# Patient Record
Sex: Female | Born: 1970 | Race: Black or African American | Hispanic: No | Marital: Married | Smoking: Former smoker
Health system: Southern US, Community
[De-identification: ages and names within clinical notes are randomized; demographics above are authoritative.]

## PROBLEM LIST (undated history)

## (undated) DIAGNOSIS — F329 Major depressive disorder, single episode, unspecified: Secondary | ICD-10-CM

## (undated) DIAGNOSIS — F32A Depression, unspecified: Secondary | ICD-10-CM

## (undated) DIAGNOSIS — R7301 Impaired fasting glucose: Secondary | ICD-10-CM

## (undated) DIAGNOSIS — F419 Anxiety disorder, unspecified: Secondary | ICD-10-CM

## (undated) DIAGNOSIS — E785 Hyperlipidemia, unspecified: Secondary | ICD-10-CM

## (undated) DIAGNOSIS — Z720 Tobacco use: Secondary | ICD-10-CM

## (undated) DIAGNOSIS — I10 Essential (primary) hypertension: Secondary | ICD-10-CM

## (undated) DIAGNOSIS — E669 Obesity, unspecified: Secondary | ICD-10-CM

## (undated) DIAGNOSIS — S41109A Unspecified open wound of unspecified upper arm, initial encounter: Secondary | ICD-10-CM

## (undated) HISTORY — DX: Impaired fasting glucose: R73.01

## (undated) HISTORY — PX: TUBAL LIGATION: SHX77

## (undated) HISTORY — DX: Obesity, unspecified: E66.9

## (undated) HISTORY — DX: Hyperlipidemia, unspecified: E78.5

## (undated) HISTORY — DX: Tobacco use: Z72.0

## (undated) HISTORY — DX: Unspecified open wound of unspecified upper arm, initial encounter: S41.109A

## (undated) HISTORY — DX: Essential (primary) hypertension: I10

---

## 1999-11-24 DIAGNOSIS — S41109A Unspecified open wound of unspecified upper arm, initial encounter: Secondary | ICD-10-CM

## 1999-11-24 HISTORY — DX: Unspecified open wound of unspecified upper arm, initial encounter: S41.109A

## 1999-11-24 HISTORY — PX: FOREARM FRACTURE SURGERY: SHX649

## 2005-01-03 ENCOUNTER — Emergency Department: Payer: Self-pay | Admitting: Emergency Medicine

## 2012-03-10 ENCOUNTER — Ambulatory Visit: Payer: Self-pay | Admitting: Internal Medicine

## 2013-12-22 ENCOUNTER — Emergency Department: Payer: Self-pay | Admitting: Emergency Medicine

## 2015-05-10 ENCOUNTER — Telehealth: Payer: Self-pay | Admitting: Family Medicine

## 2015-05-10 MED ORDER — LOSARTAN POTASSIUM 25 MG PO TABS: 25.0000 mg | ORAL_TABLET | Freq: Every day | ORAL | Status: DC

## 2015-05-10 NOTE — Telephone Encounter (Signed)
Left vm on patient phone to call us back and schedule f/u with labs. Rx was sent for 7 day supply

## 2015-05-10 NOTE — Telephone Encounter (Signed)
[facility] in [place] called stated pt needs refill on Losartan. Thanks.

## 2015-05-10 NOTE — Telephone Encounter (Signed)
Please let QUINLAN MCFALL know that I'd like to see patient for an appointment here in the office for:  Blood pressure and labs We need to know if her kidneys are happy with this new medicine I sent 7 day supply Please schedule a visit with me  in the next: 7 days Fasting?  No Thank you, Dr. Sanda Klein

## 2015-05-16 ENCOUNTER — Telehealth: Payer: Self-pay | Admitting: Family Medicine

## 2015-05-16 NOTE — Telephone Encounter (Signed)
PT CAME IN A WANTED TO GET LABS DONE BUT THERE WERE NO LAB ORDERS IN SO I MADE HER AN APPOINTMENT FOR TOMORROW JUST IN CASE SINCE WE ARE NOW SCHEDULING LABS. THE PT WOULD LIKE A CALL BACK AT [phone]

## 2015-05-17 ENCOUNTER — Other Ambulatory Visit: Payer: Self-pay

## 2015-05-17 NOTE — Telephone Encounter (Signed)
She actually needs an office visit with Dr. Sanda Klein, not just labs. Can you call her and schedule her for an appointment? Thanks!

## 2015-07-10 ENCOUNTER — Other Ambulatory Visit: Payer: Self-pay

## 2015-07-10 MED ORDER — LOSARTAN POTASSIUM 25 MG PO TABS: 25.0000 mg | ORAL_TABLET | Freq: Every day | ORAL | Status: DC

## 2015-07-10 NOTE — Telephone Encounter (Signed)
Please let Lauren Hughes know that I'd like to see patient for an appointment here in the office for:  High blood pressure and cholesterol Please schedule a visit with me  in the next: month Fasting?  Yes Thank you, Dr. Sanda Klein I DID send refill of medicine for one month to last so she won't run out

## 2015-07-12 NOTE — Telephone Encounter (Signed)
Left message to call.

## 2015-07-15 NOTE — Telephone Encounter (Signed)
Patient notified, she does not have insurance right now so she can't come in until it starts in Oct. Appt. Made for 09/09/15.

## 2015-09-02 DIAGNOSIS — I10 Essential (primary) hypertension: Secondary | ICD-10-CM | POA: Insufficient documentation

## 2015-09-02 DIAGNOSIS — Z720 Tobacco use: Secondary | ICD-10-CM | POA: Insufficient documentation

## 2015-09-02 DIAGNOSIS — R7301 Impaired fasting glucose: Secondary | ICD-10-CM | POA: Insufficient documentation

## 2015-09-02 DIAGNOSIS — E785 Hyperlipidemia, unspecified: Secondary | ICD-10-CM | POA: Insufficient documentation

## 2015-09-02 DIAGNOSIS — Z87891 Personal history of nicotine dependence: Secondary | ICD-10-CM | POA: Insufficient documentation

## 2015-09-09 ENCOUNTER — Ambulatory Visit: Payer: Self-pay | Admitting: Family Medicine

## 2015-10-16 ENCOUNTER — Encounter: Payer: Self-pay | Admitting: Family Medicine

## 2016-01-23 ENCOUNTER — Encounter (INDEPENDENT_AMBULATORY_CARE_PROVIDER_SITE_OTHER): Payer: BLUE CROSS/BLUE SHIELD | Admitting: Ophthalmology

## 2016-01-23 DIAGNOSIS — H35413 Lattice degeneration of retina, bilateral: Secondary | ICD-10-CM | POA: Diagnosis not present

## 2016-01-23 DIAGNOSIS — H43813 Vitreous degeneration, bilateral: Secondary | ICD-10-CM | POA: Diagnosis not present

## 2016-01-23 DIAGNOSIS — H33303 Unspecified retinal break, bilateral: Secondary | ICD-10-CM | POA: Diagnosis not present

## 2016-01-23 DIAGNOSIS — I10 Essential (primary) hypertension: Secondary | ICD-10-CM

## 2016-01-23 DIAGNOSIS — H35033 Hypertensive retinopathy, bilateral: Secondary | ICD-10-CM

## 2016-02-17 ENCOUNTER — Encounter (INDEPENDENT_AMBULATORY_CARE_PROVIDER_SITE_OTHER): Payer: BLUE CROSS/BLUE SHIELD | Admitting: Ophthalmology

## 2016-02-20 ENCOUNTER — Encounter (INDEPENDENT_AMBULATORY_CARE_PROVIDER_SITE_OTHER): Payer: BLUE CROSS/BLUE SHIELD | Admitting: Ophthalmology

## 2016-08-06 ENCOUNTER — Encounter: Payer: Self-pay | Admitting: Family Medicine

## 2016-08-06 ENCOUNTER — Ambulatory Visit (INDEPENDENT_AMBULATORY_CARE_PROVIDER_SITE_OTHER): Payer: BLUE CROSS/BLUE SHIELD | Admitting: Family Medicine

## 2016-08-06 VITALS — BP 177/98 | HR 96 | Temp 98.7°F | Ht 66.0 in | Wt 192.0 lb

## 2016-08-06 DIAGNOSIS — I10 Essential (primary) hypertension: Secondary | ICD-10-CM | POA: Diagnosis not present

## 2016-08-06 MED ORDER — AMLODIPINE BESYLATE 10 MG PO TABS: 10.0000 mg | ORAL_TABLET | Freq: Every day | ORAL | 0 refills | Status: DC

## 2016-08-06 NOTE — Assessment & Plan Note (Addendum)
Restart 10 mg amlodipine, BP check during PE in one month as she is overdue. She will stop in the drug store about once weekly to check BPs and let us know if it's abnormal

## 2016-08-06 NOTE — Progress Notes (Signed)
BP (!) 177/98 (BP Location: Left Arm, Patient Position: Sitting, Cuff Size: Normal)   Pulse 96   Temp 98.7 F (37.1 C)   Ht 5\' 6"  (1.676 m)   Wt 192 lb (87.1 kg)   LMP 07/31/2016 (Exact Date)   SpO2 99%   BMI 30.99 kg/m    Subjective:    Patient ID: Lauren Hughes, female    DOB: November 09, 1971, 45 y.o.   MRN: [id number]  HPI: Lauren Hughes is a 45 y.o. female  Chief Complaint  Patient presents with  . Hypertension   Patient presents for HTN follow up after being lost to follow up since May 2016. Has been off all medications for 1 month, most recently was prescribed losartan 25 mg but stopped d/t not seeing a big difference in her BPs when she would check at drugstore. Several months ago, she went back on the amlodipine (d/c'ed in May 2016 d/t being ineffective, but pt admits now that she was only taking it occasionally at that time).  Prior to both of these, she had tried lisinopril and HCTZ but did not tolerate them well d/t cough and cramps.  Wanting to get back on the amlodipine at this time. Checking her BPs occasionally at the drug store but looking into getting a home cuff.   Relevant past medical, surgical, family and social history reviewed and updated as indicated. Interim medical history since our last visit reviewed. Allergies and medications reviewed and updated.  Review of Systems  Constitutional: Negative.   HENT: Negative.   Eyes: Negative.   Respiratory: Negative.   Cardiovascular: Negative.   Gastrointestinal: Negative.   Genitourinary: Negative.   Musculoskeletal: Negative.   Neurological: Negative.   Psychiatric/Behavioral: Negative.     Per HPI unless specifically indicated above     Objective:    BP (!) 177/98 (BP Location: Left Arm, Patient Position: Sitting, Cuff Size: Normal)   Pulse 96   Temp 98.7 F (37.1 C)   Ht 5\' 6"  (1.676 m)   Wt 192 lb (87.1 kg)   LMP 07/31/2016 (Exact Date)   SpO2 99%   BMI 30.99 kg/m   Wt Readings from Last 3  Encounters:  08/06/16 192 lb (87.1 kg)  04/08/15 199 lb (90.3 kg)    Physical Exam  Constitutional: She is oriented to person, place, and time. She appears well-developed and well-nourished.  HENT:  Head: Atraumatic.  Eyes: Conjunctivae are normal. No scleral icterus.  Neck: Normal range of motion. Neck supple.  Cardiovascular: Normal rate, regular rhythm and normal heart sounds.   Pulmonary/Chest: Effort normal and breath sounds normal. No respiratory distress.  Musculoskeletal: Normal range of motion.  Neurological: She is alert and oriented to person, place, and time.  Skin: Skin is warm and dry.  Psychiatric: She has a normal mood and affect. Her behavior is normal.  Nursing note and vitals reviewed.   No results found for this or any previous visit.    Assessment & Plan:   Problem List Items Addressed This Visit      Cardiovascular and Mediastinum   Hypertension - Primary    Restart 10 mg amlodipine, BP check during PE in one month as she is overdue. She will stop in the drug store about once weekly to check BPs and let us know if it's abnormal      Relevant Medications   amLODipine (NORVASC) 10 MG tablet    Other Visit Diagnoses   None.  Follow up plan: Return in about 4 weeks (around 09/03/2016) for annual exam, BP check.

## 2016-08-06 NOTE — Patient Instructions (Signed)
Follow up for Physical Exam 

## 2016-09-03 ENCOUNTER — Ambulatory Visit (INDEPENDENT_AMBULATORY_CARE_PROVIDER_SITE_OTHER): Payer: BLUE CROSS/BLUE SHIELD | Admitting: Family Medicine

## 2016-09-03 ENCOUNTER — Encounter: Payer: Self-pay | Admitting: Family Medicine

## 2016-09-03 VITALS — BP 133/84 | HR 83 | Temp 98.8°F | Ht 66.0 in | Wt 188.0 lb

## 2016-09-03 DIAGNOSIS — Z0000 Encounter for general adult medical examination without abnormal findings: Secondary | ICD-10-CM | POA: Diagnosis not present

## 2016-09-03 DIAGNOSIS — Z23 Encounter for immunization: Secondary | ICD-10-CM

## 2016-09-03 DIAGNOSIS — R7301 Impaired fasting glucose: Secondary | ICD-10-CM | POA: Diagnosis not present

## 2016-09-03 DIAGNOSIS — E785 Hyperlipidemia, unspecified: Secondary | ICD-10-CM

## 2016-09-03 DIAGNOSIS — F330 Major depressive disorder, recurrent, mild: Secondary | ICD-10-CM

## 2016-09-03 DIAGNOSIS — Z114 Encounter for screening for human immunodeficiency virus [HIV]: Secondary | ICD-10-CM

## 2016-09-03 DIAGNOSIS — I10 Essential (primary) hypertension: Secondary | ICD-10-CM

## 2016-09-03 DIAGNOSIS — Z1329 Encounter for screening for other suspected endocrine disorder: Secondary | ICD-10-CM | POA: Diagnosis not present

## 2016-09-03 LAB — UA/M W/RFLX CULTURE, ROUTINE
Bilirubin, UA: NEGATIVE
GLUCOSE, UA: NEGATIVE
Ketones, UA: NEGATIVE
Leukocytes, UA: NEGATIVE
NITRITE UA: NEGATIVE
Specific Gravity, UA: 1.025 (ref 1.005–1.030)
UUROB: 0.2 mg/dL (ref 0.2–1.0)
pH, UA: 5 (ref 5.0–7.5)

## 2016-09-03 LAB — MICROSCOPIC EXAMINATION: WBC UA: NONE SEEN /HPF (ref 0–?)

## 2016-09-03 MED ORDER — METOPROLOL SUCCINATE ER 25 MG PO TB24: 25.0000 mg | ORAL_TABLET | Freq: Every day | ORAL | 3 refills | Status: DC

## 2016-09-03 MED ORDER — BUPROPION HCL ER (XL) 150 MG PO TB24
150.0000 mg | ORAL_TABLET | Freq: Every day | ORAL | 0 refills | Status: DC
Start: 2016-09-03 — End: 2016-10-05

## 2016-09-03 NOTE — Assessment & Plan Note (Signed)
Will start wellbutrin, discussed that this may also be a way to help with smoking cessation and to set a quit date. List of counselors given, she is agreeable to trying counseling in addition to medication
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
## 2016-09-03 NOTE — Assessment & Plan Note (Signed)
Given variability of BPs, will add metoprolol 25 mg and see how she does. Recheck in 1 month. Discussed to stop if feeling dizzy, lightheaded, etc

## 2016-09-04 ENCOUNTER — Telehealth: Payer: Self-pay | Admitting: Family Medicine

## 2016-09-04 LAB — COMPREHENSIVE METABOLIC PANEL
A/G RATIO: 1.4 (ref 1.2–2.2)
ALBUMIN: 4.2 g/dL (ref 3.5–5.5)
ALK PHOS: 70 IU/L (ref 39–117)
ALT: 21 IU/L (ref 0–32)
AST: 21 IU/L (ref 0–40)
BILIRUBIN TOTAL: 0.3 mg/dL (ref 0.0–1.2)
BUN / CREAT RATIO: 13 (ref 9–23)
BUN: 9 mg/dL (ref 6–24)
CHLORIDE: 100 mmol/L (ref 96–106)
CO2: 25 mmol/L (ref 18–29)
Calcium: 9.5 mg/dL (ref 8.7–10.2)
Creatinine, Ser: 0.67 mg/dL (ref 0.57–1.00)
GFR calc non Af Amer: 108 mL/min/{1.73_m2} (ref >59)
GFR, EST AFRICAN AMERICAN: 125 mL/min/{1.73_m2} (ref >59)
GLUCOSE: 92 mg/dL (ref 65–99)
Globulin, Total: 3 g/dL (ref 1.5–4.5)
POTASSIUM: 3.9 mmol/L (ref 3.5–5.2)
Sodium: 142 mmol/L (ref 134–144)
TOTAL PROTEIN: 7.2 g/dL (ref 6.0–8.5)

## 2016-09-04 LAB — CBC WITH DIFFERENTIAL/PLATELET
BASOS ABS: 0 10*3/uL (ref 0.0–0.2)
Basos: 0 %
EOS (ABSOLUTE): 0.2 10*3/uL (ref 0.0–0.4)
EOS: 2 %
Hematocrit: 39.2 % (ref 34.0–46.6)
Hemoglobin: 13.6 g/dL (ref 11.1–15.9)
IMMATURE GRANS (ABS): 0 10*3/uL (ref 0.0–0.1)
Immature Granulocytes: 0 %
LYMPHS ABS: 1.9 10*3/uL (ref 0.7–3.1)
Lymphs: 30 %
MCH: 32.2 pg (ref 26.6–33.0)
MCHC: 34.7 g/dL (ref 31.5–35.7)
MCV: 93 fL (ref 79–97)
MONOCYTES: 7 %
Monocytes Absolute: 0.5 10*3/uL (ref 0.1–0.9)
NEUTROS PCT: 61 %
Neutrophils Absolute: 4 10*3/uL (ref 1.4–7.0)
PLATELETS: 296 10*3/uL (ref 150–379)
RBC: 4.23 x10E6/uL (ref 3.77–5.28)
RDW: 13.7 % (ref 12.3–15.4)
WBC: 6.6 10*3/uL (ref 3.4–10.8)

## 2016-09-04 LAB — LIPID PANEL W/O CHOL/HDL RATIO
CHOLESTEROL TOTAL: 230 mg/dL — AB (ref 100–199)
HDL: 66 mg/dL (ref >39)
LDL Calculated: 145 mg/dL — ABNORMAL HIGH (ref 0–99)
Triglycerides: 95 mg/dL (ref 0–149)
VLDL Cholesterol Cal: 19 mg/dL (ref 5–40)

## 2016-09-04 LAB — TSH: TSH: 1.2 u[IU]/mL (ref 0.450–4.500)

## 2016-09-04 LAB — HEMOGLOBIN A1C
ESTIMATED AVERAGE GLUCOSE: 111 mg/dL
HEMOGLOBIN A1C: 5.5 % (ref 4.8–5.6)

## 2016-09-04 LAB — HIV ANTIBODY (ROUTINE TESTING W REFLEX): HIV Screen 4th Generation wRfx: NONREACTIVE

## 2016-09-04 NOTE — Telephone Encounter (Signed)
Left message for patient to return my call.

## 2016-09-04 NOTE — Telephone Encounter (Signed)
Please call pt and let her know all of her labs came back normal except her cholesterol is elevated. This can be improved by good diet and exercise and adding fish oil, but want to recheck in 6 months to see if we need to add a cholesterol medication if no improvement with lifestyle modification and fish oil.

## 2016-09-07 NOTE — Telephone Encounter (Signed)
Left message to call.

## 2016-09-07 NOTE — Telephone Encounter (Signed)
Patient notified

## 2016-09-09 ENCOUNTER — Other Ambulatory Visit: Payer: Self-pay | Admitting: Family Medicine

## 2016-09-15 ENCOUNTER — Encounter: Payer: Self-pay | Admitting: Family Medicine

## 2016-10-05 ENCOUNTER — Encounter: Payer: Self-pay | Admitting: Family Medicine

## 2016-10-05 ENCOUNTER — Ambulatory Visit (INDEPENDENT_AMBULATORY_CARE_PROVIDER_SITE_OTHER): Payer: BLUE CROSS/BLUE SHIELD | Admitting: Family Medicine

## 2016-10-05 VITALS — BP 144/90 | HR 85 | Temp 99.2°F | Wt 191.0 lb

## 2016-10-05 DIAGNOSIS — L918 Other hypertrophic disorders of the skin: Secondary | ICD-10-CM | POA: Diagnosis not present

## 2016-10-05 DIAGNOSIS — E785 Hyperlipidemia, unspecified: Secondary | ICD-10-CM

## 2016-10-05 DIAGNOSIS — Z720 Tobacco use: Secondary | ICD-10-CM

## 2016-10-05 DIAGNOSIS — I10 Essential (primary) hypertension: Secondary | ICD-10-CM

## 2016-10-05 DIAGNOSIS — F330 Major depressive disorder, recurrent, mild: Secondary | ICD-10-CM

## 2016-10-05 MED ORDER — BUPROPION HCL ER (XL) 150 MG PO TB24: 150.0000 mg | ORAL_TABLET | Freq: Two times a day (BID) | ORAL | 4 refills | Status: DC

## 2016-10-05 NOTE — Assessment & Plan Note (Signed)
Increase Wellbutrin to twice daily. Follow up in 1 month

## 2016-10-05 NOTE — Assessment & Plan Note (Addendum)
Continue fish oil supplements. Work on diet and exercise modifications to help lower cholesterol further. Will recheck in 4 months

## 2016-10-05 NOTE — Patient Instructions (Signed)
Follow up in 1 month. Start taking metoprolol, and increase wellbutrin to twice daily

## 2016-10-05 NOTE — Assessment & Plan Note (Signed)
Discussed setting a quit date, continue working toward cessation goals. Recommended cessation classes at [facility]

## 2016-10-05 NOTE — Progress Notes (Signed)
BP (!) 144/90   Pulse 85   Temp 99.2 F (37.3 C)   Wt 191 lb (86.6 kg)   LMP 09/20/2016   SpO2 98%   BMI 30.83 kg/m    Subjective:    Patient ID: Lauren Hughes, female    DOB: Jan 07, 1971, 45 y.o.   MRN: [id number]  HPI: Lauren Hughes is a 45 y.o. female  Chief Complaint  Patient presents with  . Hypertension    follow up, patient never started the metoprolol.  . Depression   Patient presents today for follow up HTN and depression. States she never started the metoprolol because she wasn't sure if she was supposed to or not. Checked her BP a handful of times at the pharmacy and states it was elevated at those times. Denies CP, HAs, vision changes, SOB. Still taking amlodipine daily with no side effects.   Doing well on the wellbutrin so far. States the first two weeks she felt better than she has in a very long time, very energetic and motivated. This past week she felt less like that, but still better than before. Currently taking 150 mg QD. Denies SI/HI. States the medicine has still not helped her quit smoking, but she is hoping to quit soon and wants to make New Years her quit date.   Started taking fish oil daily as recommended to help with her cholesterol. Is not currently making any diet or exercise modifications. Does work a very physical job, so stays relatively active.   Does have some skin tags that she would like to be frozen off today on her face and neck. They have been getting caught in her clothes and bleeding.   Depression screen [facility] 2/9 10/05/2016 09/03/2016  Decreased Interest 1 1  Down, Depressed, Hopeless 1 3  PHQ - 2 Score 2 4  Altered sleeping 3 1  Tired, decreased energy 2 1  Change in appetite 1 1  Feeling bad or failure about yourself  1 0  Trouble concentrating 1 0  Moving slowly or fidgety/restless 0 0  Suicidal thoughts 0 0  PHQ-9 Score 10 7  Difficult doing work/chores - Not difficult at all    Relevant past medical, surgical, family  and social history reviewed and updated as indicated. Interim medical history since our last visit reviewed. Allergies and medications reviewed and updated.  Review of Systems  Constitutional: Negative.   HENT: Negative.   Respiratory: Negative.   Cardiovascular: Negative.   Gastrointestinal: Negative.   Genitourinary: Negative.   Musculoskeletal: Negative.   Skin: Negative.   Neurological: Negative.   Psychiatric/Behavioral: Positive for dysphoric mood.    Per HPI unless specifically indicated above     Objective:    BP (!) 144/90   Pulse 85   Temp 99.2 F (37.3 C)   Wt 191 lb (86.6 kg)   LMP 09/20/2016   SpO2 98%   BMI 30.83 kg/m   Wt Readings from Last 3 Encounters:  10/05/16 191 lb (86.6 kg)  09/03/16 188 lb (85.3 kg)  08/06/16 192 lb (87.1 kg)    Physical Exam  Constitutional: She is oriented to person, place, and time. She appears well-developed and well-nourished. No distress.  Eyes: Conjunctivae are normal. Pupils are equal, round, and reactive to light. No scleral icterus.  Neck: Normal range of motion. Neck supple.  Cardiovascular: Normal rate, regular rhythm and normal heart sounds.   Pulmonary/Chest: Effort normal. No respiratory distress.  Musculoskeletal: Normal range of motion.  Neurological: She is alert and oriented to person, place, and time.  Skin: Skin is warm and dry.  Skin tag below left eye and several along anterior and posterior neck  Psychiatric: She has a normal mood and affect. Her behavior is normal.  Nursing note and vitals reviewed.   Procedure: Cryotherapy was used to destroy 5 skin tags on neck and face. The procedure was well tolerated with no immediate complications noted. Aftercare was discussed with patient.      Assessment & Plan:   Problem List Items Addressed This Visit      Cardiovascular and Mediastinum   Hypertension - Primary    Continue amlodipine, begin taking metoprolol. Recheck in 1 month        Other    Tobacco use    Discussed setting a quit date, continue working toward cessation goals. Recommended cessation classes at [facility]      Dyslipidemia    Continue fish oil supplements. Work on diet and exercise modifications to help lower cholesterol further. Will recheck in 4 months      Mild episode of recurrent major depressive disorder ([facility])    Increase Wellbutrin to twice daily. Follow up in 1 month      Relevant Medications   buPROPion (WELLBUTRIN XL) 150 MG 24 hr tablet    Other Visit Diagnoses    Inflamed skin tag       Cryotherapy was used to destroy skin tags. May require a second treatment as some were sizeable with wide bases. Discussed after care       Follow up plan: Return in about 4 weeks (around 11/02/2016) for BP, Depression.

## 2016-10-05 NOTE — Assessment & Plan Note (Signed)
Continue amlodipine, begin taking metoprolol. Recheck in 1 month

## 2016-10-07 ENCOUNTER — Encounter: Payer: Self-pay | Admitting: Family Medicine

## 2016-11-03 ENCOUNTER — Ambulatory Visit (INDEPENDENT_AMBULATORY_CARE_PROVIDER_SITE_OTHER): Payer: BLUE CROSS/BLUE SHIELD | Admitting: Family Medicine

## 2016-11-03 ENCOUNTER — Encounter: Payer: Self-pay | Admitting: Family Medicine

## 2016-11-03 VITALS — BP 129/86 | HR 94 | Temp 99.3°F | Wt 191.0 lb

## 2016-11-03 DIAGNOSIS — I10 Essential (primary) hypertension: Secondary | ICD-10-CM

## 2016-11-03 DIAGNOSIS — B309 Viral conjunctivitis, unspecified: Secondary | ICD-10-CM

## 2016-11-03 DIAGNOSIS — F330 Major depressive disorder, recurrent, mild: Secondary | ICD-10-CM | POA: Diagnosis not present

## 2016-11-03 MED ORDER — ERYTHROMYCIN 5 MG/GM OP OINT: 1.0000 "application " | TOPICAL_OINTMENT | Freq: Every day | OPHTHALMIC | 0 refills | Status: DC

## 2016-11-03 NOTE — Assessment & Plan Note (Signed)
Doing very well on wellbutrin once daily, continue current regimen.
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
## 2016-11-03 NOTE — Patient Instructions (Signed)
Follow up in 4 months for regular BP and cholesterol check. Come in fasting.

## 2016-11-03 NOTE — Assessment & Plan Note (Signed)
Stable on amlodipine, continue current regimen 

## 2017-03-09 ENCOUNTER — Ambulatory Visit (INDEPENDENT_AMBULATORY_CARE_PROVIDER_SITE_OTHER): Payer: BLUE CROSS/BLUE SHIELD | Admitting: Family Medicine

## 2017-03-09 ENCOUNTER — Encounter: Payer: Self-pay | Admitting: Family Medicine

## 2017-03-09 ENCOUNTER — Other Ambulatory Visit: Payer: Self-pay | Admitting: Family Medicine

## 2017-03-09 VITALS — BP 148/90 | HR 86 | Temp 99.0°F | Wt 197.0 lb

## 2017-03-09 DIAGNOSIS — I10 Essential (primary) hypertension: Secondary | ICD-10-CM | POA: Diagnosis not present

## 2017-03-09 DIAGNOSIS — E785 Hyperlipidemia, unspecified: Secondary | ICD-10-CM

## 2017-03-09 MED ORDER — LISINOPRIL 10 MG PO TABS: 10.0000 mg | ORAL_TABLET | Freq: Every day | ORAL | 0 refills | Status: DC

## 2017-03-09 NOTE — Assessment & Plan Note (Signed)
Not to goal, will add lisinopril 10 mg. Discussed ideal range, pt will restart home monitoring and alert [place] if abnormal readings. Side effects to watch for reviewed. F/u in 6 weeks for recheck.

## 2017-03-09 NOTE — Progress Notes (Signed)
BP (!) 148/90   Pulse 86   Temp 99 F (37.2 C)   Wt 197 lb (89.4 kg)   LMP 02/26/2017 (Exact Date)   SpO2 100%   BMI 31.80 kg/m    Subjective:    Patient ID: Lauren Hughes, female    DOB: 12-25-1970, 46 y.o.   MRN: [id number]  HPI: Lauren Hughes is a 46 y.o. female  Chief Complaint  Patient presents with  . Hypertension    no concerns, doesn't check her BP at home.  Marland Kitchen Hyperlipidemia    recheck Lipid panel today   Patient presents today for f/u HTN and dyslipidemia. Taking amlodipine faithfully with no side effects. Has not been checking her BPs the last 3-4 months. Denies CP, SOB, dizziness, HAs.   Cholesterol was elevated at PE 6 months ago, tried taking fish oil but states it made her hungry and gain weight (up 6 lb today) so she stopped. Has been working on diet and exercise but knows she can do better. Is current every day smoker, interested in quitting but not ready at this time.   Relevant past medical, surgical, family and social history reviewed and updated as indicated. Interim medical history since our last visit reviewed. Allergies and medications reviewed and updated.  Review of Systems  Constitutional: Negative.   HENT: Negative.   Respiratory: Negative.   Cardiovascular: Negative.   Gastrointestinal: Negative.   Genitourinary: Negative.   Musculoskeletal: Negative.   Neurological: Negative.   Psychiatric/Behavioral: Negative.     Per HPI unless specifically indicated above     Objective:    BP (!) 148/90   Pulse 86   Temp 99 F (37.2 C)   Wt 197 lb (89.4 kg)   LMP 02/26/2017 (Exact Date)   SpO2 100%   BMI 31.80 kg/m   Wt Readings from Last 3 Encounters:  03/09/17 197 lb (89.4 kg)  11/03/16 191 lb (86.6 kg)  10/05/16 191 lb (86.6 kg)    Physical Exam  Constitutional: She is oriented to person, place, and time. She appears well-developed and well-nourished. No distress.  HENT:  Head: Atraumatic.  Eyes: Conjunctivae are normal. Pupils  are equal, round, and reactive to light.  Neck: Normal range of motion. Neck supple.  Cardiovascular: Normal rate and normal heart sounds.   Pulmonary/Chest: Effort normal and breath sounds normal. No respiratory distress.  Musculoskeletal: Normal range of motion.  Neurological: She is alert and oriented to person, place, and time.  Skin: Skin is warm and dry.  Psychiatric: She has a normal mood and affect. Her behavior is normal.  Nursing note and vitals reviewed.     Assessment & Plan:   Problem List Items Addressed This Visit      Cardiovascular and Mediastinum   Hypertension - Primary    Not to goal, will add lisinopril 10 mg. Discussed ideal range, pt will restart home monitoring and alert [place] if abnormal readings. Side effects to watch for reviewed. F/u in 6 weeks for recheck.       Relevant Medications   lisinopril (PRINIVIL,ZESTRIL) 10 MG tablet   Other Relevant Orders   Basic Metabolic Panel (BMET)     Other   Dyslipidemia    Await fasting lab results, pt agreeable to starting a statin if no improvement in levels. Continue lifestyle modifications, including efforts toward smoking cessation.       Relevant Orders   Lipid Panel w/o Chol/HDL Ratio       Follow up  plan: Return in about 6 weeks (around 04/20/2017) for HTN.

## 2017-03-09 NOTE — Patient Instructions (Signed)
120/80-140/80 is normal range, if lower than that and dizzy or lightheaded let me know. If higher than that regularly, let me know

## 2017-03-09 NOTE — Assessment & Plan Note (Signed)
Await fasting lab results, pt agreeable to starting a statin if no improvement in levels. Continue lifestyle modifications, including efforts toward smoking cessation.

## 2017-03-10 LAB — BASIC METABOLIC PANEL
BUN/Creatinine Ratio: 17 (ref 9–23)
BUN: 11 mg/dL (ref 6–24)
CALCIUM: 9.5 mg/dL (ref 8.7–10.2)
CO2: 25 mmol/L (ref 18–29)
Chloride: 101 mmol/L (ref 96–106)
Creatinine, Ser: 0.64 mg/dL (ref 0.57–1.00)
GFR, EST AFRICAN AMERICAN: 126 mL/min/{1.73_m2} (ref >59)
GFR, EST NON AFRICAN AMERICAN: 109 mL/min/{1.73_m2} (ref >59)
Glucose: 93 mg/dL (ref 65–99)
Potassium: 4.7 mmol/L (ref 3.5–5.2)
Sodium: 140 mmol/L (ref 134–144)

## 2017-03-10 LAB — LIPID PANEL W/O CHOL/HDL RATIO
Cholesterol, Total: 211 mg/dL — ABNORMAL HIGH (ref 100–199)
HDL: 70 mg/dL (ref >39)
LDL Calculated: 127 mg/dL — ABNORMAL HIGH (ref 0–99)
Triglycerides: 70 mg/dL (ref 0–149)
VLDL CHOLESTEROL CAL: 14 mg/dL (ref 5–40)

## 2017-03-10 LAB — PLEASE NOTE

## 2017-04-20 ENCOUNTER — Ambulatory Visit: Payer: BLUE CROSS/BLUE SHIELD | Admitting: Family Medicine

## 2017-08-19 DIAGNOSIS — E669 Obesity, unspecified: Secondary | ICD-10-CM | POA: Insufficient documentation

## 2017-09-14 ENCOUNTER — Other Ambulatory Visit: Payer: Self-pay | Admitting: Orthopedic Surgery

## 2017-09-14 DIAGNOSIS — M25561 Pain in right knee: Secondary | ICD-10-CM

## 2017-09-14 DIAGNOSIS — M1711 Unilateral primary osteoarthritis, right knee: Secondary | ICD-10-CM | POA: Insufficient documentation

## 2017-09-14 DIAGNOSIS — M25361 Other instability, right knee: Secondary | ICD-10-CM

## 2017-09-14 DIAGNOSIS — M2391 Unspecified internal derangement of right knee: Secondary | ICD-10-CM

## 2017-09-29 ENCOUNTER — Ambulatory Visit
Admission: RE | Admit: 2017-09-29 | Discharge: 2017-09-29 | Disposition: A | Discharge status: Home / Self Care | Payer: BLUE CROSS/BLUE SHIELD | Source: Ambulatory Visit | Attending: Orthopedic Surgery | Admitting: Orthopedic Surgery

## 2017-09-29 DIAGNOSIS — M25561 Pain in right knee: Secondary | ICD-10-CM | POA: Diagnosis present

## 2017-09-29 DIAGNOSIS — X58XXXA Exposure to other specified factors, initial encounter: Secondary | ICD-10-CM | POA: Diagnosis not present

## 2017-09-29 DIAGNOSIS — M25361 Other instability, right knee: Secondary | ICD-10-CM

## 2017-09-29 DIAGNOSIS — M1711 Unilateral primary osteoarthritis, right knee: Secondary | ICD-10-CM

## 2017-09-29 DIAGNOSIS — S83231A Complex tear of medial meniscus, current injury, right knee, initial encounter: Secondary | ICD-10-CM | POA: Diagnosis not present

## 2017-09-29 DIAGNOSIS — M2391 Unspecified internal derangement of right knee: Secondary | ICD-10-CM

## 2017-10-18 ENCOUNTER — Other Ambulatory Visit: Payer: Self-pay

## 2017-10-18 ENCOUNTER — Encounter: Payer: Self-pay | Admitting: *Deleted

## 2017-10-20 ENCOUNTER — Inpatient Hospital Stay: Admission: RE | Admit: 2017-10-20 | Payer: BLUE CROSS/BLUE SHIELD | Source: Ambulatory Visit

## 2017-10-26 ENCOUNTER — Ambulatory Visit
Admission: RE | Admit: 2017-10-26 | Discharge: 2017-10-26 | Disposition: A | Discharge status: Home / Self Care | Payer: BLUE CROSS/BLUE SHIELD | Source: Ambulatory Visit | Attending: Orthopedic Surgery | Admitting: Orthopedic Surgery

## 2017-10-26 ENCOUNTER — Ambulatory Visit: Payer: BLUE CROSS/BLUE SHIELD | Admitting: Anesthesiology

## 2017-10-26 ENCOUNTER — Encounter
Admission: RE | Disposition: A | Discharge status: Home / Self Care | Payer: Self-pay | Source: Ambulatory Visit | Attending: Orthopedic Surgery

## 2017-10-26 ENCOUNTER — Ambulatory Visit: Admit: 2017-10-26 | Payer: BLUE CROSS/BLUE SHIELD | Admitting: Orthopedic Surgery

## 2017-10-26 DIAGNOSIS — F17210 Nicotine dependence, cigarettes, uncomplicated: Secondary | ICD-10-CM | POA: Diagnosis not present

## 2017-10-26 DIAGNOSIS — S83231A Complex tear of medial meniscus, current injury, right knee, initial encounter: Secondary | ICD-10-CM | POA: Insufficient documentation

## 2017-10-26 DIAGNOSIS — Z8249 Family history of ischemic heart disease and other diseases of the circulatory system: Secondary | ICD-10-CM | POA: Diagnosis not present

## 2017-10-26 DIAGNOSIS — I10 Essential (primary) hypertension: Secondary | ICD-10-CM | POA: Insufficient documentation

## 2017-10-26 DIAGNOSIS — E669 Obesity, unspecified: Secondary | ICD-10-CM | POA: Insufficient documentation

## 2017-10-26 DIAGNOSIS — Z6831 Body mass index (BMI) 31.0-31.9, adult: Secondary | ICD-10-CM | POA: Insufficient documentation

## 2017-10-26 DIAGNOSIS — Z823 Family history of stroke: Secondary | ICD-10-CM | POA: Insufficient documentation

## 2017-10-26 DIAGNOSIS — Z79899 Other long term (current) drug therapy: Secondary | ICD-10-CM | POA: Diagnosis not present

## 2017-10-26 DIAGNOSIS — Z888 Allergy status to other drugs, medicaments and biological substances status: Secondary | ICD-10-CM | POA: Diagnosis not present

## 2017-10-26 DIAGNOSIS — Z9889 Other specified postprocedural states: Secondary | ICD-10-CM | POA: Diagnosis not present

## 2017-10-26 DIAGNOSIS — F329 Major depressive disorder, single episode, unspecified: Secondary | ICD-10-CM | POA: Insufficient documentation

## 2017-10-26 DIAGNOSIS — W1840XA Slipping, tripping and stumbling without falling, unspecified, initial encounter: Secondary | ICD-10-CM | POA: Diagnosis not present

## 2017-10-26 DIAGNOSIS — M1711 Unilateral primary osteoarthritis, right knee: Secondary | ICD-10-CM | POA: Insufficient documentation

## 2017-10-26 HISTORY — PX: MENISECTOMY: SHX5181

## 2017-10-26 HISTORY — PX: CHONDROPLASTY: SHX5177

## 2017-10-26 SURGERY — ARTHROSCOPY, KNEE, WITH MEDIAL MENISCECTOMY
Anesthesia: Choice | Laterality: Right

## 2017-10-26 SURGERY — CHONDRECTOMY, KNEE, SEMILUNAR CARTILAGE
Anesthesia: General | Laterality: Right | Wound class: Clean

## 2017-10-26 MED ORDER — IBUPROFEN 800 MG PO TABS: 800.0000 mg | ORAL_TABLET | Freq: Three times a day (TID) | ORAL | 0 refills | Status: AC

## 2017-10-26 MED ORDER — LACTATED RINGERS IV SOLN
INTRAVENOUS | Status: DC
  Administered 2017-10-26: 11:00:00 via INTRAVENOUS

## 2017-10-26 MED ORDER — LIDOCAINE HCL (CARDIAC) 20 MG/ML IV SOLN
INTRAVENOUS | Status: DC | PRN
  Administered 2017-10-26: 40 mg via INTRATRACHEAL

## 2017-10-26 MED ORDER — GLYCOPYRROLATE 0.2 MG/ML IJ SOLN
INTRAMUSCULAR | Status: DC | PRN
Start: 2017-10-26 — End: 2017-10-26
  Administered 2017-10-26: 0.1 mg via INTRAVENOUS

## 2017-10-26 MED ORDER — ASPIRIN EC 325 MG PO TBEC: 325.0000 mg | DELAYED_RELEASE_TABLET | Freq: Every day | ORAL | 0 refills | Status: AC

## 2017-10-26 MED ORDER — FENTANYL CITRATE (PF) 100 MCG/2ML IJ SOLN
25.0000 ug | INTRAMUSCULAR | Status: DC | PRN
  Administered 2017-10-26 (×2): 25 ug via INTRAVENOUS

## 2017-10-26 MED ORDER — MIDAZOLAM HCL 5 MG/5ML IJ SOLN
INTRAMUSCULAR | Status: DC | PRN
  Administered 2017-10-26: 2 mg via INTRAVENOUS

## 2017-10-26 MED ORDER — OXYCODONE HCL 5 MG PO TABS
5.0000 mg | ORAL_TABLET | Freq: Once | ORAL | Status: AC | PRN
  Administered 2017-10-26: 5 mg via ORAL

## 2017-10-26 MED ORDER — CEFAZOLIN SODIUM-DEXTROSE 2-4 GM/100ML-% IV SOLN
2.0000 g | Freq: Once | INTRAVENOUS | Status: AC
  Administered 2017-10-26: 2 g via INTRAVENOUS

## 2017-10-26 MED ORDER — LACTATED RINGERS IV SOLN: INTRAVENOUS | Status: DC

## 2017-10-26 MED ORDER — BUPIVACAINE HCL 0.5 % IJ SOLN
INTRAMUSCULAR | Status: DC | PRN
  Administered 2017-10-26: 15 mL via INTRA_ARTICULAR
  Administered 2017-10-26: 2.5 mL via INTRA_ARTICULAR

## 2017-10-26 MED ORDER — LIDOCAINE-EPINEPHRINE 1 %-1:100000 IJ SOLN
INTRAMUSCULAR | Status: DC | PRN
  Administered 2017-10-26: 2.5 mL
  Administered 2017-10-26: 15 mL

## 2017-10-26 MED ORDER — HYDROCODONE-ACETAMINOPHEN 5-325 MG PO TABS: 1.0000 | ORAL_TABLET | ORAL | 0 refills | Status: DC | PRN

## 2017-10-26 MED ORDER — OXYCODONE HCL 5 MG/5ML PO SOLN: 5.0000 mg | Freq: Once | ORAL | Status: AC | PRN

## 2017-10-26 MED ORDER — ACETAMINOPHEN 10 MG/ML IV SOLN: 1000.0000 mg | Freq: Once | INTRAVENOUS | Status: DC | PRN

## 2017-10-26 MED ORDER — PROPOFOL 10 MG/ML IV BOLUS
INTRAVENOUS | Status: DC | PRN
  Administered 2017-10-26: 200 mg via INTRAVENOUS

## 2017-10-26 MED ORDER — ONDANSETRON HCL 4 MG/2ML IJ SOLN
4.0000 mg | Freq: Once | INTRAMUSCULAR | Status: AC | PRN
  Administered 2017-10-26: 4 mg via INTRAVENOUS

## 2017-10-26 MED ORDER — DEXAMETHASONE SODIUM PHOSPHATE 4 MG/ML IJ SOLN
INTRAMUSCULAR | Status: DC | PRN
  Administered 2017-10-26: 4 mg via INTRAVENOUS

## 2017-10-26 MED ORDER — FENTANYL CITRATE (PF) 100 MCG/2ML IJ SOLN
INTRAMUSCULAR | Status: DC | PRN
  Administered 2017-10-26 (×3): 25 ug via INTRAVENOUS
  Administered 2017-10-26: 50 ug via INTRAVENOUS
  Administered 2017-10-26: 25 ug via INTRAVENOUS

## 2017-10-26 MED ORDER — ONDANSETRON 4 MG PO TBDP: 4.0000 mg | ORAL_TABLET | Freq: Three times a day (TID) | ORAL | 0 refills | Status: DC | PRN

## 2017-10-26 SURGICAL SUPPLY — 61 items
ADAPTER IRRIG TUBE 2 SPIKE SOL (ADAPTER) ×2 IMPLANT
BLADE SURG 15 STRL LF DISP TIS (BLADE) ×1 IMPLANT
BLADE SURG 15 STRL SS (BLADE) ×1
BLADE SURG SZ11 CARB STEEL (BLADE) ×2 IMPLANT
BNDG COHESIVE 4X5 TAN STRL (GAUZE/BANDAGES/DRESSINGS) ×2 IMPLANT
BNDG ESMARK 6X12 TAN STRL LF (GAUZE/BANDAGES/DRESSINGS) IMPLANT
BRACE KNEE POST OP SHORT (BRACE) ×2 IMPLANT
BRUSH SCRUB EZ  4% CHG (MISCELLANEOUS) ×1
BRUSH SCRUB EZ 4% CHG (MISCELLANEOUS) ×1 IMPLANT
BUR RADIUS 3.5 (BURR) ×2 IMPLANT
BUR RADIUS 4.0X18.5 (BURR) ×2 IMPLANT
BUTTON SUTURE 12 NAB (SUTURE) ×2 IMPLANT
CHLORAPREP W/TINT 26ML (MISCELLANEOUS) ×2 IMPLANT
CLEANER CAUTERY TIP 5X5 PAD (MISCELLANEOUS) ×1 IMPLANT
COOLER POLAR GLACIER W/PUMP (MISCELLANEOUS) ×2 IMPLANT
CUFF TOURN SGL QUICK 24 (TOURNIQUET CUFF)
CUFF TOURN SGL QUICK 30 (MISCELLANEOUS)
CUFF TOURN SGL QUICK 34 (TOURNIQUET CUFF) ×1
CUFF TRNQT CYL 24X4X40X1 (TOURNIQUET CUFF) IMPLANT
CUFF TRNQT CYL 34X4X40X1 (TOURNIQUET CUFF) ×1 IMPLANT
CUFF TRNQT CYL LO 30X4X (MISCELLANEOUS) IMPLANT
DRAPE IMP U-DRAPE 54X76 (DRAPES) ×2 IMPLANT
DRILL FLIPCUTTER II W/DRILL 6 (INSTRUMENTS) ×1 IMPLANT
FLIPCUTTER II W/DRILL 6 (INSTRUMENTS) ×2
GAUZE SPONGE 4X4 12PLY STRL (GAUZE/BANDAGES/DRESSINGS) ×2 IMPLANT
GLOVE BIO SURGEON STRL SZ7.5 (GLOVE) ×2 IMPLANT
GLOVE BIOGEL PI IND STRL 8 (GLOVE) ×1 IMPLANT
GLOVE BIOGEL PI INDICATOR 8 (GLOVE) ×1
GOWN STRL REUS W/ TWL LRG LVL3 (GOWN DISPOSABLE) ×1 IMPLANT
GOWN STRL REUS W/TWL LRG LVL3 (GOWN DISPOSABLE) ×3 IMPLANT
IV LACTATED RINGER IRRG 3000ML (IV SOLUTION) ×4
IV LR IRRIG 3000ML ARTHROMATIC (IV SOLUTION) ×4 IMPLANT
KIT RM TURNOVER STRD PROC AR (KITS) ×2 IMPLANT
MAT BLUE FLOOR 46X72 FLO (MISCELLANEOUS) ×2 IMPLANT
NDL MAYO CATGUT SZ5 (NEEDLE) ×1
NDL SUT 5 .5 CRC TPR PNT MAYO (NEEDLE) ×1 IMPLANT
NEPTUNE MANIFOLD (MISCELLANEOUS) ×2 IMPLANT
PACK ARTHROSCOPY KNEE (MISCELLANEOUS) ×2 IMPLANT
PAD ABD DERMACEA PRESS 5X9 (GAUZE/BANDAGES/DRESSINGS) ×4 IMPLANT
PAD CLEANER CAUTERY TIP 5X5 (MISCELLANEOUS) ×1
PAD WRAPON POLAR KNEE (MISCELLANEOUS) ×1 IMPLANT
PENCIL ELECTRO HAND CTR (MISCELLANEOUS) ×2 IMPLANT
SET TUBE SUCT SHAVER OUTFL 24K (TUBING) ×2 IMPLANT
SET TUBE TIP INTRA-ARTICULAR (MISCELLANEOUS) ×2 IMPLANT
STRIP CLOSURE SKIN 1/2X4 (GAUZE/BANDAGES/DRESSINGS) ×2 IMPLANT
SUT ETHILON 3-0 FS-10 30 BLK (SUTURE) ×2
SUT ETHILON 4-0 (SUTURE) ×1
SUT ETHILON 4-0 FS2 18XMFL BLK (SUTURE) ×1
SUT FIBERSTICK 2-0 (SUTURE) ×2 IMPLANT
SUT MNCRL 4-0 (SUTURE) ×1
SUT MNCRL 4-0 27XMFL (SUTURE) ×1
SUT VIC AB 2-0 SH 27 (SUTURE) ×1
SUT VIC AB 2-0 SH 27XBRD (SUTURE) ×1 IMPLANT
SUTURE EHLN 3-0 FS-10 30 BLK (SUTURE) ×1 IMPLANT
SUTURE ETHLN 4-0 FS2 18XMF BLK (SUTURE) ×1 IMPLANT
SUTURE MNCRL 4-0 27XMF (SUTURE) ×1 IMPLANT
TOWEL OR 17X26 4PK STRL BLUE (TOWEL DISPOSABLE) ×4 IMPLANT
TUBING ARTHRO INFLOW-ONLY STRL (TUBING) ×2 IMPLANT
WAND HAND CNTRL MULTIVAC 50 (MISCELLANEOUS) IMPLANT
WAND HAND CNTRL MULTIVAC 90 (MISCELLANEOUS) IMPLANT
WRAPON POLAR PAD KNEE (MISCELLANEOUS) ×2

## 2017-10-26 NOTE — H&P (Signed)
Paper H&P to be scanned into permanent record. H&P reviewed. Discussed possibility of meniscus repair based on tear pattern and that I would make this decision intraoperatively. Patient agrees with repair if there is a tear at or near the root and tear is amenable to it. She understands the difference in rehab protocol afterwards if repair is performed. Consent updated to reflect this possibility. No significant changes to H&P otherwise noted.

## 2017-10-26 NOTE — Discharge Instructions (Signed)
Arthroscopic Knee Surgery - Partial Meniscectomy  Post-Op Instructions  1. Bracing or crutches: Crutches will be provided at the time of discharge from the surgery center if you do not already have them.  2. Ice: You may be provided with a device [facility]) that allows you to ice the affected area effectively. Otherwise you can ice manually.   3. Driving:  Plan on not driving for at least two weeks. Please note that you are advised NOT to drive while taking narcotic pain medications as you may be impaired and unsafe to drive.  4. Activity: Ankle pumps several times an hour while awake to prevent blood clots. Weight bearing: as tolerated. Use crutches for as needed (usually ~1 week or less) until pain allows you to ambulate without a limp. Bending and straightening the knee is unlimited. Elevate knee above heart level as much as possible for one week. Avoid standing more than 5 minutes (consecutively) for the first week.  Avoid long distance travel for 2 weeks.  5. Medications:  - You have been provided a prescription for narcotic pain medicine. After surgery, take 1-2 narcotic tablets every 4 hours if needed for severe pain.  - A prescription for anti-nausea medication will be provided in case the narcotic medicine causes nausea - take 1 tablet every 6 hours only if nauseated.  - Take ibuprofen 800 mg every 8 hours WITH food to reduce post-operative knee swelling. DO NOT STOP IBUPROFEN POST-OP UNTIL INSTRUCTED TO DO SO at first post-op office visit (10-14 days after surgery). However, please discontinue if you have any abdominal discomfort after taking this.  - Take enteric coated aspirin 325 mg once daily for 2 weeks to prevent blood clots.    6. Bandages: The physical therapist should change the bandages at the first post-op appointment. If needed, the dressing supplies have been provided to you.  7. Physical Therapy: 1-2 times per week for 6 weeks. Therapy typically starts on post  operative Day 3 or 4. You have been provided an order for physical therapy. The therapist will provide home exercises.  8. Work: May return to full work usually around 2 weeks after 1st post-operative visit. May do light duty/desk job in approximately 1-2 weeks when off of narcotics, pain is well-controlled, and swelling has decreased. Labor intensive jobs may require 4-6 weeks to return.   9. Post-Op Appointments: Your first post-op appointment will be with Dr. Posey Pronto in approximately 2 weeks time.   If you find that they have not been scheduled please call the Orthopaedic Appointment front desk at [phone].   General Anesthesia, Adult, Care After These instructions provide you with information about caring for yourself after your procedure. Your health care provider may also give you more specific instructions. Your treatment has been planned according to current medical practices, but problems sometimes occur. Call your health care provider if you have any problems or questions after your procedure. What can I expect after the procedure? After the procedure, it is common to have:  Vomiting.  A sore throat.  Mental slowness.  It is common to feel:  Nauseous.  Cold or shivery.  Sleepy.  Tired.  Sore or achy, even in parts of your body where you did not have surgery.  Follow these instructions at home: For at least 24 hours after the procedure:  Do not: ? Participate in activities where you could fall or become injured. ? Drive. ? Use heavy machinery. ? Drink alcohol. ? Take sleeping pills or medicines  that cause drowsiness. ? Make important decisions or sign legal documents. ? Take care of children on your own.  Rest. Eating and drinking  If you vomit, drink water, juice, or soup when you can drink without vomiting.  Drink enough fluid to keep your urine clear or pale yellow.  Make sure you have little or no nausea before eating solid foods.  Follow the  diet recommended by your health care provider. General instructions  Have a responsible adult stay with you until you are awake and alert.  Return to your normal activities as told by your health care provider. Ask your health care provider what activities are safe for you.  Take over-the-counter and prescription medicines only as told by your health care provider.  If you smoke, do not smoke without supervision.  Keep all follow-up visits as told by your health care provider. This is important. Contact a health care provider if:  You continue to have nausea or vomiting at home, and medicines are not helpful.  You cannot drink fluids or start eating again.  You cannot urinate after 8-12 hours.  You develop a skin rash.  You have fever.  You have increasing redness at the site of your procedure. Get help right away if:  You have difficulty breathing.  You have chest pain.  You have unexpected bleeding.  You feel that you are having a life-threatening or urgent problem. This information is not intended to replace advice given to you by your health care provider. Make sure you discuss any questions you have with your health care provider. Document Released: 02/15/2001 Document Revised: 04/13/2016 Document Reviewed: 10/24/2015 Elsevier Interactive Patient Education  [employer].

## 2017-10-26 NOTE — Anesthesia Preprocedure Evaluation (Signed)
Anesthesia Evaluation  Patient identified by MRN, date of birth, ID band Patient awake    History of Anesthesia Complications Negative for: history of anesthetic complications  Airway Mallampati: I  TM Distance: >3 FB Neck ROM: Full    Dental   Missing several molars; no loose or chipped teeth per pt:   Pulmonary Current Smoker (1/2 ppd),    Pulmonary exam normal breath sounds clear to auscultation       Cardiovascular Exercise Tolerance: Good hypertension, Normal cardiovascular exam Rhythm:Regular Rate:Normal     Neuro/Psych PSYCHIATRIC DISORDERS Depression negative neurological ROS     GI/Hepatic negative GI ROS,   Endo/Other  negative endocrine ROS  Renal/GU negative Renal ROS     Musculoskeletal   Abdominal   Peds  Hematology negative hematology ROS (+)   Anesthesia Other Findings   Reproductive/Obstetrics                             Anesthesia Physical Anesthesia Plan  ASA: II  Anesthesia Plan: General   Post-op Pain Management:    Induction: Intravenous  PONV Risk Score and Plan: 1 and Ondansetron and Dexamethasone  Airway Management Planned: LMA  Additional Equipment:   Intra-op Plan:   Post-operative Plan: Extubation in OR  Informed Consent: I have reviewed the patients History and Physical, chart, labs and discussed the procedure including the risks, benefits and alternatives for the proposed anesthesia with the patient or authorized representative who has indicated his/her understanding and acceptance.     Plan Discussed with: CRNA  Anesthesia Plan Comments:         Anesthesia Quick Evaluation

## 2017-10-26 NOTE — Transfer of Care (Signed)
Immediate Anesthesia Transfer of Care Note  Patient: Lauren Hughes  Procedure(s) Performed: MENISECTOMY PARTIAL MEDIAL (Right ) CHONDROPLASTY (Right )  Patient Location: PACU  Anesthesia Type: General  Level of Consciousness: awake, alert  and patient cooperative  Airway and Oxygen Therapy: Patient Spontanous Breathing and Patient connected to supplemental oxygen  Post-op Assessment: Post-op Vital signs reviewed, Patient's Cardiovascular Status Stable, Respiratory Function Stable, Patent Airway and No signs of Nausea or vomiting  Post-op Vital Signs: Reviewed and stable  Complications: No apparent anesthesia complications

## 2017-10-26 NOTE — Op Note (Signed)
Operative Note   SURGERY DATE: 10/26/2017  PRE-OP DIAGNOSIS:  1. Right medial meniscus tear  POST-OP DIAGNOSIS: 1. Right medial meniscus tear  PROCEDURES:  1. Right knee arthroscopy, partial medial meniscectomy 2. Chondroplasty of patellofemoral compartment  SURGEON: Cato Mulligan, MD  ANESTHESIA: Gen  ESTIMATED BLOOD LOSS:minimal  TOTAL IV FLUIDS: per anesthesia  INDICATION(S):  Lauren Hughes is a 46 y.o. female with a history of R knee pain located on the medial aspect of her knee. MRI showed a medial meniscus tear. She had failed conservative measures previously. After discussion of risks, benefits, and alternatives to surgery, the patient elected to proceed.    OPERATIVE FINDINGS:   Examination under anesthesia:A careful examination under anesthesia was performed. Passive range of motion was: Hyperextension: 5. Extension: 0. Flexion: 125. Lachman: normal. Pivot Shift: normal. Posterior drawer: normal. Varus stability in full extension: normal. Varus stability in 30 degrees of flexion: normal. Valgus stability in full extension: normal. Valgus stability in 30 degrees of flexion: normal.  Intra-operative findings:A thorough arthroscopic examination of the knee was performed. The findings are: 1. Suprapatellar pouch: Normal 2. Undersurface of median ridge: Normal 3. Medial patellar facet: Focal area of Grade 2 degenerative changes 4. Lateral patellar facet: Diffuse Grade 1 degenerative changes 5. Trochlea: normal 6. Lateral gutter/popliteus tendon: Normal 7. Hoffa's fat pad: Inflamed 8. Medial gutter/plica: Normal 9. ACL: Normal 10. PCL: Normal 11. Medial meniscus: Complex tear with both radial and horizontal components of the posterior horn/body junction. No meniscus root tear was identified. 12. Medial compartment cartilage: Grade 1 degenerative changes diffusely, linear area of Grade 2 changes 13. Lateral meniscus: normal 14. Lateral  compartment cartilage: Normal  OPERATIVE REPORT:   I identified Lauren Weberg Tuckerin the pre-operative holding area. I marked theoperativeknee with my initials. I reviewed the risks and benefits of the proposed surgical intervention and the patient (and/or patient's guardian) wished to proceed. The patient was transferred to the operative suite and placed in the supine position with all bony prominences padded. Anesthesia was administered. Appropriate IV antibioticswere administered within 30 minutes before incision. The extremity was then prepped and draped in standard fashion. A time out was performed confirming the correct extremity, correct patient, and correct procedure.  Arthroscopy portals were marked. Local anesthetic was injected to the planned portal sites. The anterolateral portalwasestablished with an 11blade.  The arthroscope was placed in the anterolateral portal and theninto the suprapatellar pouch. A diagnostic knee scope was completed with the above findings. The medial meniscus tear was identified.  Next the medial portal was established under needle localization. The meniscal tear was debrided using an arthroscopic biter and an oscillating shaver until the meniscus had stable borders.  A chondroplasty was performed of the patellofemoral compartment such that there were no loose fragments of cartilage. Arthroscopic fluid was removed from the joint.  The portals were closed with 3-0 Nylon suture. Sterile dressings included Xeroform, 4x4s, Sof-Rol, and Bias wrap. A Polarcare was placed.  The patient was then awakened and taken to the PACU hemodynamically stable without complication.   POSTOPERATIVE PLAN: The patient will be discharged home today once they meet PACU criteria. Aspirin 325 mg daily was prescribed for 2 weeks for DVT prophylaxis. Physical therapy will start on POD#3-4.Weight-bearing as tolerated. They will follow up in 2 weeks per protocol.

## 2017-10-26 NOTE — Anesthesia Procedure Notes (Signed)
Procedure Name: LMA Insertion Date/Time: 10/26/2017 12:20 PM Performed by: Mayme Genta, CRNA Pre-anesthesia Checklist: Patient identified, Emergency Drugs available, Suction available, Timeout performed and Patient being monitored Patient Re-evaluated:Patient Re-evaluated prior to induction Oxygen Delivery Method: Circle system utilized Preoxygenation: Pre-oxygenation with 100% oxygen Induction Type: IV induction LMA: LMA inserted LMA Size: 4.0 Number of attempts: 1 Placement Confirmation: positive ETCO2 and breath sounds checked- equal and bilateral Tube secured with: Tape

## 2017-10-26 NOTE — Anesthesia Postprocedure Evaluation (Signed)
Anesthesia Post Note  Patient: Lauren Hughes  Procedure(s) Performed: MENISECTOMY PARTIAL MEDIAL (Right ) CHONDROPLASTY (Right )  Patient location during evaluation: PACU Anesthesia Type: General Level of consciousness: awake and alert, oriented and patient cooperative Pain management: pain level controlled Vital Signs Assessment: post-procedure vital signs reviewed and stable Respiratory status: spontaneous breathing, nonlabored ventilation and respiratory function stable Cardiovascular status: blood pressure returned to baseline and stable Postop Assessment: adequate PO intake Anesthetic complications: no    Darrin Nipper

## 2017-10-27 ENCOUNTER — Encounter: Payer: Self-pay | Admitting: Orthopedic Surgery

## 2017-11-18 ENCOUNTER — Encounter: Payer: Self-pay | Admitting: Obstetrics and Gynecology

## 2017-11-18 ENCOUNTER — Ambulatory Visit (INDEPENDENT_AMBULATORY_CARE_PROVIDER_SITE_OTHER): Payer: BLUE CROSS/BLUE SHIELD | Admitting: Obstetrics and Gynecology

## 2017-11-18 VITALS — BP 140/90 | HR 107 | Ht 65.0 in | Wt 200.0 lb

## 2017-11-18 DIAGNOSIS — Z124 Encounter for screening for malignant neoplasm of cervix: Secondary | ICD-10-CM | POA: Diagnosis not present

## 2017-11-18 DIAGNOSIS — Z01419 Encounter for gynecological examination (general) (routine) without abnormal findings: Secondary | ICD-10-CM

## 2017-11-18 DIAGNOSIS — Z1239 Encounter for other screening for malignant neoplasm of breast: Secondary | ICD-10-CM

## 2017-11-18 DIAGNOSIS — Z1231 Encounter for screening mammogram for malignant neoplasm of breast: Secondary | ICD-10-CM

## 2017-11-18 DIAGNOSIS — Z1211 Encounter for screening for malignant neoplasm of colon: Secondary | ICD-10-CM | POA: Diagnosis not present

## 2017-11-18 NOTE — Patient Instructions (Addendum)
Preventive Care 18-39 Years, Female Preventive care refers to lifestyle choices and visits with your health care provider that can promote health and wellness. What does preventive care include?  A yearly physical exam. This is also called an annual well check.  Dental exams once or twice a year.  Routine eye exams. Ask your health care provider how often you should have your eyes checked.  Personal lifestyle choices, including: ? Daily care of your teeth and gums. ? Regular physical activity. ? Eating a healthy diet. ? Avoiding tobacco and drug use. ? Limiting alcohol use. ? Practicing safe sex. ? Taking vitamin and mineral supplements as recommended by your health care provider. What happens during an annual well check? The services and screenings done by your health care provider during your annual well check will depend on your age, overall health, lifestyle risk factors, and family history of disease. Counseling Your health care provider may ask you questions about your:  Alcohol use.  Tobacco use.  Drug use.  Emotional well-being.  Home and relationship well-being.  Sexual activity.  Eating habits.  Work and work Statistician.  Method of birth control.  Menstrual cycle.  Pregnancy history.  Screening You may have the following tests or measurements:  Height, weight, and BMI.  Diabetes screening. This is done by checking your blood sugar (glucose) after you have not eaten for a while (fasting).  Blood pressure.  Lipid and cholesterol levels. These may be checked every 5 years starting at age 38.  Skin check.  Hepatitis C blood test.  Hepatitis B blood test.  Sexually transmitted disease (STD) testing.  BRCA-related cancer screening. This may be done if you have a family history of breast, ovarian, tubal, or peritoneal cancers.  Pelvic exam and Pap test. This may be done every 3 years starting at age 38. Starting at age 30, this may be done  every 5 years if you have a Pap test in combination with an HPV test.  Discuss your test results, treatment options, and if necessary, the need for more tests with your health care provider. Vaccines Your health care provider may recommend certain vaccines, such as:  Influenza vaccine. This is recommended every year.  Tetanus, diphtheria, and acellular pertussis (Tdap, Td) vaccine. You may need a Td booster every 10 years.  Varicella vaccine. You may need this if you have not been vaccinated.  HPV vaccine. If you are 39 or younger, you may need three doses over 6 months.  Measles, mumps, and rubella (MMR) vaccine. You may need at least one dose of MMR. You may also need a second dose.  Pneumococcal 13-valent conjugate (PCV13) vaccine. You may need this if you have certain conditions and were not previously vaccinated.  Pneumococcal polysaccharide (PPSV23) vaccine. You may need one or two doses if you smoke cigarettes or if you have certain conditions.  Meningococcal vaccine. One dose is recommended if you are age 68-21 years and a first-year college student living in a residence hall, or if you have one of several medical conditions. You may also need additional booster doses.  Hepatitis A vaccine. You may need this if you have certain conditions or if you travel or work in places where you may be exposed to hepatitis A.  Hepatitis B vaccine. You may need this if you have certain conditions or if you travel or work in places where you may be exposed to hepatitis B.  Haemophilus influenzae type b (Hib) vaccine. You may need this  if you have certain risk factors.  Talk to your health care provider about which screenings and vaccines you need and how often you need them. This information is not intended to replace advice given to you by your health care provider. Make sure you discuss any questions you have with your health care provider. Document Released: 01/05/2002 Document Revised:  07/29/2016 Document Reviewed: 09/10/2015 Elsevier Interactive Patient Education  2018 Elsevier Inc.  

## 2017-11-18 NOTE — Progress Notes (Signed)
Gynecology Annual Exam  PCP: Volney American, PA-C  Chief Complaint:  Chief Complaint  Patient presents with  . Gynecologic Exam    History of Present Illness: Patient is a 46 y.o. No obstetric history on file. presents for annual exam. The patient has no complaints today.   LMP: No LMP recorded. Average Interval: regular, monthly Duration of flow: 4 days Heavy Menses: no Clots: no Intermenstrual Bleeding: no Postcoital Bleeding: no Dysmenorrhea: no   The patient is sexually active. She currently uses tubal ligation for contraception. She denies dyspareunia.  The patient does perform self breast exams.  There is no notable family history of breast or ovarian cancer in her family.  The patient wears seatbelts: yes.   The patient has regular exercise: not asked.    The patient denies current symptoms of depression.    Review of Systems: Review of Systems  Constitutional: Negative for chills and fever.  HENT: Negative for congestion.   Respiratory: Negative for cough and shortness of breath.   Cardiovascular: Negative for chest pain and palpitations.  Gastrointestinal: Negative for abdominal pain, constipation, diarrhea, heartburn, nausea and vomiting.  Genitourinary: Negative for dysuria, frequency and urgency.  Skin: Negative for itching and rash.  Neurological: Negative for dizziness and headaches.  Endo/Heme/Allergies: Negative for polydipsia.  Psychiatric/Behavioral: Negative for depression.    Past Medical History:  Past Medical History:  Diagnosis Date  . Degloving injury of arm 2001  . Dyslipidemia   . Hypertension   . IFG (impaired fasting glucose)   . Obesity   . Tobacco use     Past Surgical History:  Past Surgical History:  Procedure Laterality Date  . CHONDROPLASTY Right 10/26/2017   Procedure: CHONDROPLASTY;  Surgeon: Leim Fabry, MD;  Location: [facility];  Service: Orthopedics;  Laterality: Right;  . FOREARM FRACTURE SURGERY  Right 2001  . MENISECTOMY Right 10/26/2017   Procedure: MENISECTOMY PARTIAL MEDIAL;  Surgeon: Leim Fabry, MD;  Location: [facility];  Service: Orthopedics;  Laterality: Right;  . TUBAL LIGATION      Gynecologic History:  No LMP recorded. Contraception: tubal ligation Last Pap: Results were: 10/06/2016 NIL and HR HPV+ 16 and 18 negative  Obstetric History: No obstetric history on file.  Family History:  Family History  Problem Relation Age of Onset  . Anemia Mother   . Diabetes Mother   . Hyperlipidemia Mother   . Hypertension Mother   . Stroke Mother   . Hypertension Father   . Stroke Father   . Cancer Maternal Grandfather        colon?    Social History:  Social History   Socioeconomic History  . Marital status: Married    Spouse name: Not on file  . Number of children: Not on file  . Years of education: Not on file  . Highest education level: Not on file  Social Needs  . Financial resource strain: Not on file  . Food insecurity - worry: Not on file  . Food insecurity - inability: Not on file  . Transportation needs - medical: Not on file  . Transportation needs - non-medical: Not on file  Occupational History  . Not on file  Tobacco Use  . Smoking status: Current Every Day Smoker    Packs/day: 0.50    Years: 20.00    Pack years: 10.00    Types: Cigars  . Smokeless tobacco: Never Used  Substance and Sexual Activity  . Alcohol use: Yes  Alcohol/week: 8.4 oz    Types: 14 Cans of beer per week    Comment:    . Drug use: No  . Sexual activity: Yes    Birth control/protection: None  Other Topics Concern  . Not on file  Social History Narrative  . Not on file    Allergies:  Allergies  Allergen Reactions  . Metoprolol Other (See Comments)    Leg cramping    Medications: Prior to Admission medications   Medication Sig Start Date End Date Taking? Authorizing Provider  amLODipine (NORVASC) 10 MG tablet TAKE ONE TABLET BY MOUTH ONCE DAILY  09/09/16  Yes Volney American, PA-C  buPROPion (WELLBUTRIN XL) 150 MG 24 hr tablet Take 1 tablet (150 mg total) by mouth 2 (two) times daily. 10/05/16  Yes Volney American, PA-C  Caffeine (KEEP ALERT PO) Take 1 capsule by mouth daily as needed (Jet Alert).   Yes [provider]    Physical Exam Vitals: Blood pressure 140/90, pulse (!) 107, height 5\' 5"  (1.651 m), weight 200 lb (90.7 kg).  General: NAD HEENT: normocephalic, anicteric Thyroid: no enlargement, no palpable nodules Pulmonary: No increased work of breathing, CTAB Cardiovascular: RRR, distal pulses 2+ Breast: Breast symmetrical, no tenderness, no palpable nodules or masses, no skin or nipple retraction present, no nipple discharge.  No axillary or supraclavicular lymphadenopathy. Abdomen: NABS, soft, non-tender, non-distended.  Umbilicus without lesions.  No hepatomegaly, splenomegaly or masses palpable. No evidence of hernia  Genitourinary:  External: Normal external female genitalia.  Normal urethral meatus, normal  Bartholin's and Skene's glands.    Vagina: Normal vaginal mucosa, no evidence of prolapse.    Cervix: Grossly normal in appearance, no bleeding  Uterus: Non-enlarged, mobile, normal contour.  No CMT  Adnexa: ovaries non-enlarged, no adnexal masses  Rectal: deferred  Lymphatic: no evidence of inguinal lymphadenopathy Extremities: no edema, erythema, or tenderness Neurologic: Grossly intact Psychiatric: mood appropriate, affect full  Female chaperone present for pelvic and breast  portions of the physical exam    Assessment: 46 y.o. routine annual exam  Plan: Problem List Items Addressed This Visit    None    Visit Diagnoses    Screening for colon cancer    -  Primary   Relevant Orders   Ambulatory referral to Gastroenterology   Screening for malignant neoplasm of cervix       Relevant Orders   PapIG, HPV, rfx 16/18   Breast screening       Relevant Orders   MM DIGITAL  SCREENING BILATERAL   Encounter for gynecological examination without abnormal finding          1) Mammogram - recommend yearly screening mammogram.  Mammogram Was ordered today   2) STI screening was not offered  3) ASCCP guidelines and rational discussed.  Patient opts for yearly screening interval - follow up + HPV last year  4) Contraception - s/p BTL, no menstrual concerns  5) Colonoscopy -- Screening recommended starting at age 46 for average risk individuals, age 11 for individuals deemed at increased risk (including African Americans) and recommended to continue until age 69.  For patient age 57-85 individualized approach is recommended.  Gold standard screening is via colonoscopy, Cologuard screening is an acceptable alternative for patient unwilling or unable to undergo colonoscopy.  "Colorectal cancer screening for average?risk adults: 2018 guideline update from the American Cancer Society"[place]: A Cancer Journal for Clinicians: Apr 21, 2017  - colonoscopy ordered  6) Routine healthcare maintenance including cholesterol,  diabetes screening discussed managed by PCP

## 2017-11-20 LAB — PAPIG, HPV, RFX 16/18
HPV, high-risk: NEGATIVE
PAP SMEAR COMMENT: 0

## 2017-11-21 ENCOUNTER — Encounter: Payer: Self-pay | Admitting: Obstetrics and Gynecology

## 2017-12-13 ENCOUNTER — Ambulatory Visit (INDEPENDENT_AMBULATORY_CARE_PROVIDER_SITE_OTHER): Payer: BLUE CROSS/BLUE SHIELD | Admitting: Family Medicine

## 2017-12-13 ENCOUNTER — Encounter: Payer: Self-pay | Admitting: Family Medicine

## 2017-12-13 VITALS — BP 183/107 | HR 88 | Temp 98.2°F | Wt 200.5 lb

## 2017-12-13 DIAGNOSIS — I10 Essential (primary) hypertension: Secondary | ICD-10-CM | POA: Diagnosis not present

## 2017-12-13 DIAGNOSIS — F330 Major depressive disorder, recurrent, mild: Secondary | ICD-10-CM | POA: Diagnosis not present

## 2017-12-13 MED ORDER — BUPROPION HCL ER (XL) 150 MG PO TB24: 150.0000 mg | ORAL_TABLET | Freq: Every day | ORAL | 3 refills | Status: DC

## 2017-12-13 MED ORDER — LOSARTAN POTASSIUM 50 MG PO TABS: 50.0000 mg | ORAL_TABLET | Freq: Every day | ORAL | 1 refills | Status: DC

## 2017-12-13 MED ORDER — AMLODIPINE BESYLATE 10 MG PO TABS: 10.0000 mg | ORAL_TABLET | Freq: Every day | ORAL | 3 refills | Status: DC

## 2017-12-13 NOTE — Progress Notes (Signed)
BP (!) 183/107   Pulse 88   Temp 98.2 F (36.8 C) (Oral)   Wt 200 lb 8 oz (90.9 kg)   SpO2 100%   BMI 33.36 kg/m    Subjective:    Patient ID: Lauren Hughes, female    DOB: Jan 01, 1971, 47 y.o.   MRN: [id number]  HPI: Lauren Hughes is a 47 y.o. female  Chief Complaint  Patient presents with  . Depression  . Hypertension    pt states she has been out of medication for a week    Pt here today for BP and depression f/u. Has had lots of orthopedic issues lately so has missed her regular appt and run out of her wellbutrin but still taking amlodipine. Was put on lisinopril about 8 months ago in addition to the amlodipine but had a constant hacking cough with it so self-d/cd after a few days. BPs have been above normal when checked outside of the office on just the amlodipine. Denies CP, SOB, dizziness, HAs.  Wanting very much to go back on wellbutrin. Feels a huge difference in the weeks that she's been of of it. States her moods were very well controlled on it just 150 mg once daily. Denies SI/HI.   Past Medical History:  Diagnosis Date  . Degloving injury of arm 2001  . Dyslipidemia   . Hypertension   . IFG (impaired fasting glucose)   . Obesity   . Tobacco use    Social History   Socioeconomic History  . Marital status: Married    Spouse name: Not on file  . Number of children: Not on file  . Years of education: Not on file  . Highest education level: Not on file  Social Needs  . Financial resource strain: Not on file  . Food insecurity - worry: Not on file  . Food insecurity - inability: Not on file  . Transportation needs - medical: Not on file  . Transportation needs - non-medical: Not on file  Occupational History  . Not on file  Tobacco Use  . Smoking status: Current Every Day Smoker    Packs/day: 0.50    Years: 20.00    Pack years: 10.00    Types: Cigars  . Smokeless tobacco: Never Used  Substance and Sexual Activity  . Alcohol use: Yes   Alcohol/week: 8.4 oz    Types: 14 Cans of beer per week    Comment:    . Drug use: No  . Sexual activity: Yes    Birth control/protection: None  Other Topics Concern  . Not on file  Social History Narrative  . Not on file   Relevant past medical, surgical, family and social history reviewed and updated as indicated. Interim medical history since our last visit reviewed. Allergies and medications reviewed and updated.  Review of Systems  Constitutional: Negative.   Respiratory: Negative.   Cardiovascular: Negative.   Gastrointestinal: Negative.   Genitourinary: Negative.   Musculoskeletal: Negative.   Skin: Negative.   Neurological: Negative.   Psychiatric/Behavioral: Negative.    Per HPI unless specifically indicated above     Objective:    BP (!) 183/107   Pulse 88   Temp 98.2 F (36.8 C) (Oral)   Wt 200 lb 8 oz (90.9 kg)   SpO2 100%   BMI 33.36 kg/m   Wt Readings from Last 3 Encounters:  12/13/17 200 lb 8 oz (90.9 kg)  11/18/17 200 lb (90.7 kg)  10/26/17 195 lb (  88.5 kg)    Physical Exam  Constitutional: She is oriented to person, place, and time. She appears well-developed and well-nourished. No distress.  HENT:  Head: Atraumatic.  Mouth/Throat: Oropharynx is clear and moist. No oropharyngeal exudate.  Eyes: Conjunctivae are normal. Pupils are equal, round, and reactive to light. No scleral icterus.  Neck: Normal range of motion. Neck supple.  Cardiovascular: Normal rate and normal heart sounds.  Pulmonary/Chest: Effort normal and breath sounds normal. No respiratory distress.  Musculoskeletal: Normal range of motion.  Neurological: She is alert and oriented to person, place, and time.  Skin: Skin is warm and dry.  Psychiatric: She has a normal mood and affect. Her behavior is normal.  Nursing note and vitals reviewed.  Results for orders placed or performed in visit on 11/18/17  PapIG, HPV, rfx 16/18  Result Value Ref Range   DIAGNOSIS: Comment     Specimen adequacy: Comment    Clinician Provided ICD10 Comment    Performed by: Comment    PAP Smear Comment .    Note: Comment    Test Methodology Comment    HPV, high-risk Negative Negative      Assessment & Plan:   Problem List Items Addressed This Visit      Cardiovascular and Mediastinum   Hypertension    Continue amlodipine, will add losartan 50 mg and titrate up until at goal. Discussed continued efforts toward DASH diet, increasing exercise      Relevant Medications   losartan (COZAAR) 50 MG tablet   amLODipine (NORVASC) 10 MG tablet     Other   Mild episode of recurrent major depressive disorder ([place]) - Primary    Will restart wellbutrin 150 mg once daily. F/u with concerns      Relevant Medications   buPROPion (WELLBUTRIN XL) 150 MG 24 hr tablet       Follow up plan: Return in about 8 weeks (around 02/07/2018) for BP, CPE.

## 2017-12-16 NOTE — Assessment & Plan Note (Signed)
Will restart wellbutrin 150 mg once daily. F/u with concerns

## 2017-12-16 NOTE — Assessment & Plan Note (Signed)
Continue amlodipine, will add losartan 50 mg and titrate up until at goal. Discussed continued efforts toward DASH diet, increasing exercise

## 2017-12-16 NOTE — Patient Instructions (Signed)
Follow up in 8 weeks 

## 2017-12-22 ENCOUNTER — Encounter: Payer: Self-pay | Admitting: Family Medicine

## 2017-12-23 ENCOUNTER — Other Ambulatory Visit: Payer: Self-pay | Admitting: Family Medicine

## 2017-12-23 MED ORDER — CLONIDINE HCL 0.1 MG PO TABS: 0.1000 mg | ORAL_TABLET | Freq: Two times a day (BID) | ORAL | 0 refills | Status: DC

## 2018-01-04 ENCOUNTER — Other Ambulatory Visit: Payer: Self-pay

## 2018-02-09 ENCOUNTER — Encounter: Payer: Self-pay | Admitting: Family Medicine

## 2018-02-09 ENCOUNTER — Ambulatory Visit (INDEPENDENT_AMBULATORY_CARE_PROVIDER_SITE_OTHER): Payer: BLUE CROSS/BLUE SHIELD | Admitting: Family Medicine

## 2018-02-09 VITALS — BP 144/90 | HR 96 | Temp 98.8°F | Ht 65.25 in | Wt 201.4 lb

## 2018-02-09 DIAGNOSIS — F330 Major depressive disorder, recurrent, mild: Secondary | ICD-10-CM

## 2018-02-09 DIAGNOSIS — Z720 Tobacco use: Secondary | ICD-10-CM

## 2018-02-09 DIAGNOSIS — I10 Essential (primary) hypertension: Secondary | ICD-10-CM

## 2018-02-09 DIAGNOSIS — Z1239 Encounter for other screening for malignant neoplasm of breast: Secondary | ICD-10-CM

## 2018-02-09 DIAGNOSIS — Z0000 Encounter for general adult medical examination without abnormal findings: Secondary | ICD-10-CM

## 2018-02-09 DIAGNOSIS — Z1231 Encounter for screening mammogram for malignant neoplasm of breast: Secondary | ICD-10-CM | POA: Diagnosis not present

## 2018-02-09 DIAGNOSIS — R5383 Other fatigue: Secondary | ICD-10-CM | POA: Diagnosis not present

## 2018-02-09 MED ORDER — VARENICLINE TARTRATE 0.5 MG X 11 & 1 MG X 42 PO MISC: ORAL | 0 refills | Status: DC

## 2018-02-09 MED ORDER — BUPROPION HCL ER (XL) 150 MG PO TB24: 300.0000 mg | ORAL_TABLET | Freq: Every day | ORAL | 1 refills | Status: DC

## 2018-02-09 NOTE — Patient Instructions (Signed)
[facility] - [phone]    

## 2018-02-09 NOTE — Progress Notes (Deleted)
   BP (!) 177/78 (BP Location: Right Arm, Patient Position: Sitting, Cuff Size: Normal)   Pulse 96   Temp 98.8 F (37.1 C) (Oral)   Ht 5' 5.25" (1.657 m)   Wt 201 lb 6.4 oz (91.4 kg)   SpO2 100%   BMI 33.26 kg/m    Subjective:    Patient ID: Lauren Hughes, female    DOB: Jan 07, 1971, 47 y.o.   MRN: [id number]  HPI: Lauren Hughes is a 47 y.o. female  Chief Complaint  Patient presents with  . Annual Exam    Patient states no complaints.   150s/90s at home when checked.   Relevant past medical, surgical, family and social history reviewed and updated as indicated. Interim medical history since our last visit reviewed. Allergies and medications reviewed and updated.  Review of Systems  Per HPI unless specifically indicated above     Objective:    BP (!) 177/78 (BP Location: Right Arm, Patient Position: Sitting, Cuff Size: Normal)   Pulse 96   Temp 98.8 F (37.1 C) (Oral)   Ht 5' 5.25" (1.657 m)   Wt 201 lb 6.4 oz (91.4 kg)   SpO2 100%   BMI 33.26 kg/m   Wt Readings from Last 3 Encounters:  02/09/18 201 lb 6.4 oz (91.4 kg)  12/13/17 200 lb 8 oz (90.9 kg)  11/18/17 200 lb (90.7 kg)    Physical Exam  Results for orders placed or performed in visit on 11/18/17  PapIG, HPV, rfx 16/18  Result Value Ref Range   DIAGNOSIS: Comment    Specimen adequacy: Comment    Clinician Provided ICD10 Comment    Performed by: Comment    PAP Smear Comment .    Note: Comment    Test Methodology Comment    HPV, high-risk Negative Negative      Assessment & Plan:   Problem List Items Addressed This Visit    None       Follow up plan: No Follow-up on file.

## 2018-02-10 ENCOUNTER — Encounter: Payer: Self-pay | Admitting: Family Medicine

## 2018-02-10 ENCOUNTER — Other Ambulatory Visit: Payer: Self-pay | Admitting: Family Medicine

## 2018-02-10 LAB — COMPREHENSIVE METABOLIC PANEL
ALT: 14 IU/L (ref 0–32)
AST: 19 IU/L (ref 0–40)
Albumin/Globulin Ratio: 1.5 (ref 1.2–2.2)
Albumin: 4 g/dL (ref 3.5–5.5)
Alkaline Phosphatase: 64 IU/L (ref 39–117)
BILIRUBIN TOTAL: 0.3 mg/dL (ref 0.0–1.2)
BUN/Creatinine Ratio: 13 (ref 9–23)
BUN: 10 mg/dL (ref 6–24)
CHLORIDE: 101 mmol/L (ref 96–106)
CO2: 24 mmol/L (ref 20–29)
Calcium: 9.4 mg/dL (ref 8.7–10.2)
Creatinine, Ser: 0.75 mg/dL (ref 0.57–1.00)
GFR calc non Af Amer: 97 mL/min/{1.73_m2} (ref >59)
GFR, EST AFRICAN AMERICAN: 111 mL/min/{1.73_m2} (ref >59)
GLUCOSE: 93 mg/dL (ref 65–99)
Globulin, Total: 2.6 g/dL (ref 1.5–4.5)
Potassium: 4.2 mmol/L (ref 3.5–5.2)
Sodium: 138 mmol/L (ref 134–144)
TOTAL PROTEIN: 6.6 g/dL (ref 6.0–8.5)

## 2018-02-10 LAB — CBC WITH DIFFERENTIAL/PLATELET
BASOS ABS: 0 10*3/uL (ref 0.0–0.2)
Basos: 0 %
EOS (ABSOLUTE): 0.1 10*3/uL (ref 0.0–0.4)
Eos: 2 %
Hematocrit: 40 % (ref 34.0–46.6)
Hemoglobin: 13.4 g/dL (ref 11.1–15.9)
IMMATURE GRANS (ABS): 0 10*3/uL (ref 0.0–0.1)
IMMATURE GRANULOCYTES: 0 %
LYMPHS: 32 %
Lymphocytes Absolute: 2.1 10*3/uL (ref 0.7–3.1)
MCH: 32.4 pg (ref 26.6–33.0)
MCHC: 33.5 g/dL (ref 31.5–35.7)
MCV: 97 fL (ref 79–97)
Monocytes Absolute: 0.6 10*3/uL (ref 0.1–0.9)
Monocytes: 9 %
NEUTROS ABS: 3.8 10*3/uL (ref 1.4–7.0)
NEUTROS PCT: 57 %
PLATELETS: 307 10*3/uL (ref 150–379)
RBC: 4.13 x10E6/uL (ref 3.77–5.28)
RDW: 13.6 % (ref 12.3–15.4)
WBC: 6.6 10*3/uL (ref 3.4–10.8)

## 2018-02-10 LAB — TSH: TSH: 1.31 u[IU]/mL (ref 0.450–4.500)

## 2018-02-10 LAB — VITAMIN D 25 HYDROXY (VIT D DEFICIENCY, FRACTURES): Vit D, 25-Hydroxy: 8.3 ng/mL — ABNORMAL LOW (ref 30.0–100.0)

## 2018-02-10 LAB — LIPID PANEL W/O CHOL/HDL RATIO
Cholesterol, Total: 189 mg/dL (ref 100–199)
HDL: 54 mg/dL (ref >39)
LDL CALC: 121 mg/dL — AB (ref 0–99)
Triglycerides: 72 mg/dL (ref 0–149)
VLDL CHOLESTEROL CAL: 14 mg/dL (ref 5–40)

## 2018-02-10 LAB — VITAMIN B12: VITAMIN B 12: 563 pg/mL (ref 232–1245)

## 2018-02-10 MED ORDER — CLONIDINE HCL 0.1 MG PO TABS: 0.1000 mg | ORAL_TABLET | Freq: Two times a day (BID) | ORAL | 1 refills | Status: DC

## 2018-02-11 ENCOUNTER — Other Ambulatory Visit: Payer: Self-pay | Admitting: Family Medicine

## 2018-02-11 LAB — UA/M W/RFLX CULTURE, ROUTINE
Bilirubin, UA: NEGATIVE
GLUCOSE, UA: NEGATIVE
Ketones, UA: NEGATIVE
NITRITE UA: NEGATIVE
PROTEIN UA: NEGATIVE
SPEC GRAV UA: 1.02 (ref 1.005–1.030)
UUROB: 0.2 mg/dL (ref 0.2–1.0)
pH, UA: 5 (ref 5.0–7.5)

## 2018-02-11 LAB — MICROSCOPIC EXAMINATION

## 2018-02-11 LAB — URINE CULTURE, REFLEX

## 2018-02-11 MED ORDER — VITAMIN D (ERGOCALCIFEROL) 1.25 MG (50000 UNIT) PO CAPS: 50000.0000 [IU] | ORAL_CAPSULE | ORAL | 3 refills | Status: DC

## 2018-02-11 NOTE — Assessment & Plan Note (Signed)
Wanting to try increasing wellbutrin. Wants to take 2 150 mg wellbutrin tablets so she can easily go down to 1 again if not tolerated. Will start this new dose and monitor for benefit
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
## 2018-02-11 NOTE — Assessment & Plan Note (Signed)
BPs still high normal or above normal consistently despite lifestyle changes and amlodipine, strongly encouraged her to go ahead and start the clonidine and monitor BPs regularly. Pt agreeable. Continue lifestyle modifications.

## 2018-02-11 NOTE — Assessment & Plan Note (Signed)
Long discussion about readiness and options. Pt without any hx of SI and wanting to try chantix. Risks and benefits reviewed at length, including risks of vivid dreams and SI. Encouraged her to take for 8-12 weeks even if she quits sooner.

## 2018-02-15 ENCOUNTER — Encounter: Payer: Self-pay | Admitting: Family Medicine

## 2018-04-06 ENCOUNTER — Ambulatory Visit: Payer: BLUE CROSS/BLUE SHIELD | Admitting: Family Medicine

## 2018-05-28 ENCOUNTER — Encounter: Payer: Self-pay | Admitting: Family Medicine

## 2018-06-08 ENCOUNTER — Ambulatory Visit (INDEPENDENT_AMBULATORY_CARE_PROVIDER_SITE_OTHER): Payer: BLUE CROSS/BLUE SHIELD | Admitting: Family Medicine

## 2018-06-08 ENCOUNTER — Encounter: Payer: Self-pay | Admitting: Family Medicine

## 2018-06-08 VITALS — BP 131/82 | HR 87 | Temp 98.3°F | Ht 65.5 in | Wt 199.0 lb

## 2018-06-08 DIAGNOSIS — Z720 Tobacco use: Secondary | ICD-10-CM | POA: Diagnosis not present

## 2018-06-08 DIAGNOSIS — F330 Major depressive disorder, recurrent, mild: Secondary | ICD-10-CM | POA: Diagnosis not present

## 2018-06-08 DIAGNOSIS — I10 Essential (primary) hypertension: Secondary | ICD-10-CM | POA: Diagnosis not present

## 2018-06-08 MED ORDER — BUPROPION HCL ER (XL) 150 MG PO TB24: 300.0000 mg | ORAL_TABLET | Freq: Every day | ORAL | 1 refills | Status: DC

## 2018-06-08 MED ORDER — CLONIDINE HCL 0.1 MG PO TABS: 0.1000 mg | ORAL_TABLET | Freq: Every day | ORAL | 2 refills | Status: DC

## 2018-06-08 NOTE — Assessment & Plan Note (Signed)
Doing much better with the increased wellbutrin dose, taking 300 mg daily now. Continue current regimen
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
## 2018-06-08 NOTE — Assessment & Plan Note (Signed)
Much improved with clonidine once daily, continue current regimen as well as lifestyle modifications

## 2018-06-11 NOTE — Patient Instructions (Signed)
Follow up as needed

## 2018-06-11 NOTE — Assessment & Plan Note (Signed)
Not ready to quit. Will continue counseling for cessation in future

## 2018-07-12 ENCOUNTER — Encounter: Payer: Self-pay | Admitting: Family Medicine

## 2018-08-16 ENCOUNTER — Encounter: Payer: Self-pay | Admitting: Family Medicine

## 2018-08-17 ENCOUNTER — Other Ambulatory Visit: Payer: Self-pay | Admitting: Family Medicine

## 2018-08-17 DIAGNOSIS — Z800 Family history of malignant neoplasm of digestive organs: Secondary | ICD-10-CM

## 2018-08-17 DIAGNOSIS — Z1211 Encounter for screening for malignant neoplasm of colon: Secondary | ICD-10-CM

## 2018-08-18 ENCOUNTER — Other Ambulatory Visit: Payer: Self-pay

## 2018-08-18 DIAGNOSIS — Z1211 Encounter for screening for malignant neoplasm of colon: Secondary | ICD-10-CM

## 2018-09-13 ENCOUNTER — Encounter: Payer: Self-pay | Admitting: Family Medicine

## 2018-09-27 ENCOUNTER — Encounter: Payer: Self-pay | Admitting: Student

## 2018-09-28 ENCOUNTER — Ambulatory Visit (INDEPENDENT_AMBULATORY_CARE_PROVIDER_SITE_OTHER): Payer: BLUE CROSS/BLUE SHIELD | Admitting: Family Medicine

## 2018-09-28 ENCOUNTER — Encounter: Payer: Self-pay | Admitting: Family Medicine

## 2018-09-28 ENCOUNTER — Encounter: Payer: Self-pay | Admitting: *Deleted

## 2018-09-28 ENCOUNTER — Ambulatory Visit
Admission: RE | Admit: 2018-09-28 | Discharge: 2018-09-28 | Disposition: A | Discharge status: Home / Self Care | Payer: BLUE CROSS/BLUE SHIELD | Source: Ambulatory Visit | Attending: Gastroenterology | Admitting: Gastroenterology

## 2018-09-28 ENCOUNTER — Ambulatory Visit: Payer: BLUE CROSS/BLUE SHIELD | Admitting: Anesthesiology

## 2018-09-28 ENCOUNTER — Encounter
Admission: RE | Disposition: A | Discharge status: Home / Self Care | Payer: Self-pay | Source: Ambulatory Visit | Attending: Gastroenterology

## 2018-09-28 VITALS — BP 135/86 | HR 79 | Temp 98.6°F | Wt 199.9 lb

## 2018-09-28 DIAGNOSIS — K5730 Diverticulosis of large intestine without perforation or abscess without bleeding: Secondary | ICD-10-CM

## 2018-09-28 DIAGNOSIS — Z1211 Encounter for screening for malignant neoplasm of colon: Secondary | ICD-10-CM

## 2018-09-28 DIAGNOSIS — F329 Major depressive disorder, single episode, unspecified: Secondary | ICD-10-CM | POA: Diagnosis not present

## 2018-09-28 DIAGNOSIS — R7301 Impaired fasting glucose: Secondary | ICD-10-CM | POA: Diagnosis not present

## 2018-09-28 DIAGNOSIS — D125 Benign neoplasm of sigmoid colon: Secondary | ICD-10-CM | POA: Diagnosis not present

## 2018-09-28 DIAGNOSIS — I10 Essential (primary) hypertension: Secondary | ICD-10-CM | POA: Insufficient documentation

## 2018-09-28 DIAGNOSIS — Z87891 Personal history of nicotine dependence: Secondary | ICD-10-CM | POA: Insufficient documentation

## 2018-09-28 DIAGNOSIS — Z8371 Family history of colonic polyps: Secondary | ICD-10-CM | POA: Diagnosis not present

## 2018-09-28 DIAGNOSIS — Z800 Family history of malignant neoplasm of digestive organs: Secondary | ICD-10-CM | POA: Diagnosis not present

## 2018-09-28 DIAGNOSIS — Z6833 Body mass index (BMI) 33.0-33.9, adult: Secondary | ICD-10-CM | POA: Insufficient documentation

## 2018-09-28 DIAGNOSIS — F419 Anxiety disorder, unspecified: Secondary | ICD-10-CM | POA: Insufficient documentation

## 2018-09-28 DIAGNOSIS — E669 Obesity, unspecified: Secondary | ICD-10-CM | POA: Insufficient documentation

## 2018-09-28 DIAGNOSIS — E785 Hyperlipidemia, unspecified: Secondary | ICD-10-CM | POA: Diagnosis not present

## 2018-09-28 DIAGNOSIS — Z1231 Encounter for screening mammogram for malignant neoplasm of breast: Secondary | ICD-10-CM

## 2018-09-28 DIAGNOSIS — N644 Mastodynia: Secondary | ICD-10-CM | POA: Diagnosis not present

## 2018-09-28 DIAGNOSIS — K635 Polyp of colon: Secondary | ICD-10-CM

## 2018-09-28 HISTORY — DX: Depression, unspecified: F32.A

## 2018-09-28 HISTORY — PX: COLONOSCOPY WITH PROPOFOL: SHX5780

## 2018-09-28 HISTORY — DX: Anxiety disorder, unspecified: F41.9

## 2018-09-28 HISTORY — DX: Major depressive disorder, single episode, unspecified: F32.9

## 2018-09-28 LAB — POCT PREGNANCY, URINE: Preg Test, Ur: NEGATIVE

## 2018-09-28 SURGERY — COLONOSCOPY WITH PROPOFOL
Anesthesia: General

## 2018-09-28 MED ORDER — PHENYLEPHRINE HCL 10 MG/ML IJ SOLN
INTRAMUSCULAR | Status: DC | PRN
  Administered 2018-09-28: 100 ug via INTRAVENOUS

## 2018-09-28 MED ORDER — MIDAZOLAM HCL 5 MG/5ML IJ SOLN
INTRAMUSCULAR | Status: DC | PRN
  Administered 2018-09-28: 1 mg via INTRAVENOUS

## 2018-09-28 MED ORDER — FENTANYL CITRATE (PF) 100 MCG/2ML IJ SOLN
INTRAMUSCULAR | Status: AC
  Filled 2018-09-28: qty 2

## 2018-09-28 MED ORDER — FENTANYL CITRATE (PF) 100 MCG/2ML IJ SOLN
INTRAMUSCULAR | Status: DC | PRN
  Administered 2018-09-28: 50 ug via INTRAVENOUS

## 2018-09-28 MED ORDER — LIDOCAINE HCL (PF) 2 % IJ SOLN
INTRAMUSCULAR | Status: AC
  Filled 2018-09-28: qty 10

## 2018-09-28 MED ORDER — MIDAZOLAM HCL 2 MG/2ML IJ SOLN
INTRAMUSCULAR | Status: AC
  Filled 2018-09-28: qty 2

## 2018-09-28 MED ORDER — PROPOFOL 10 MG/ML IV BOLUS
INTRAVENOUS | Status: DC | PRN
  Administered 2018-09-28 (×2): 45.4 mg via INTRAVENOUS

## 2018-09-28 MED ORDER — PROPOFOL 500 MG/50ML IV EMUL
INTRAVENOUS | Status: AC
  Filled 2018-09-28: qty 50

## 2018-09-28 MED ORDER — PROPOFOL 500 MG/50ML IV EMUL
INTRAVENOUS | Status: DC | PRN
  Administered 2018-09-28: 140 ug/kg/min via INTRAVENOUS

## 2018-09-28 MED ORDER — SODIUM CHLORIDE 0.9 % IV SOLN
INTRAVENOUS | Status: DC
  Administered 2018-09-28: 1000 mL via INTRAVENOUS

## 2018-09-28 NOTE — Progress Notes (Signed)
BP 135/86   Pulse 79   Temp 98.6 F (37 C) (Oral)   Wt 199 lb 14.4 oz (90.7 kg)   SpO2 99%   BMI 33.27 kg/m    Subjective:    Patient ID: Lauren Hughes, female    DOB: Jan 10, 1971, 47 y.o.   MRN: [id number]  HPI: COLBY CATANESE is a 47 y.o. female  Chief Complaint  Patient presents with  . Mammogram    No signs symptoms. 1 prior @ [place]. Pt reports abnormal   Here today with left lateral breast pain that has been present intermittently the past year, worse the past month. Thinks it may be related to her menstrual cycles but has not been able to nail down a pattern well. Denies notice of a mass, nipple discharge, skin changes. States she has had an abnormal mammogram in the past that ended up being benign but does not know specific details. No fhx of breast cancer.   Relevant past medical, surgical, family and social history reviewed and updated as indicated. Interim medical history since our last visit reviewed. Allergies and medications reviewed and updated.  Review of Systems  Per HPI unless specifically indicated above     Objective:    BP 135/86   Pulse 79   Temp 98.6 F (37 C) (Oral)   Wt 199 lb 14.4 oz (90.7 kg)   SpO2 99%   BMI 33.27 kg/m   Wt Readings from Last 3 Encounters:  09/28/18 199 lb 14.4 oz (90.7 kg)  09/28/18 200 lb (90.7 kg)  06/08/18 199 lb (90.3 kg)    Physical Exam  Constitutional: She is oriented to person, place, and time. She appears well-developed and well-nourished. No distress.  HENT:  Head: Atraumatic.  Eyes: Pupils are equal, round, and reactive to light. Conjunctivae and EOM are normal.  Neck: Normal range of motion. Neck supple.  Cardiovascular: Normal rate and regular rhythm.  Pulmonary/Chest: Effort normal and breath sounds normal. Right breast exhibits no mass, no nipple discharge, no skin change and no tenderness. Left breast exhibits no mass, no nipple discharge, no skin change and no tenderness.  Musculoskeletal:  Normal range of motion.  Lymphadenopathy:    She has no axillary adenopathy.  Neurological: She is alert and oriented to person, place, and time.  Skin: Skin is warm and dry. No erythema.  Psychiatric: She has a normal mood and affect. Her behavior is normal.  Nursing note and vitals reviewed.   Results for orders placed or performed during the hospital encounter of 09/28/18  Pregnancy, urine POC  Result Value Ref Range   Preg Test, Ur NEGATIVE NEGATIVE  Surgical pathology  Result Value Ref Range   SURGICAL PATHOLOGY      Surgical Pathology CASE: [id number] PATIENT: Ezelle Judy Surgical Pathology Report     SPECIMEN SUBMITTED: A. Colon polyp x2, sigmoid; cold snare/cbx  CLINICAL HISTORY: None provided  PRE-OPERATIVE DIAGNOSIS: Family history of colon cancer, screening colonoscopy  POST-OPERATIVE DIAGNOSIS: Diverticulosis; polyps     DIAGNOSIS: A. COLON POLYPS X2, SIGMOID; COLD SNARE/BIOPSY: - HYPERPLASTIC POLYPS (X2). - NEGATIVE FOR DYSPLASIA AND MALIGNANCY.   GROSS DESCRIPTION: A. Labeled: Cold snare/cbx sigmoid colon polyp x2 Received: In formalin Tissue fragment(s): 2 Size: 0.3 and 0.5 cm Description: Pink-tan fragments Entirely submitted in one cassette.    Final Diagnosis performed by Allena Napoleon, MD.   Electronically signed 09/29/2018 11:59:50AM The electronic signature indicates that the named Attending Pathologist has evaluated the specimen  Technical component performed  at [facility], [address] [place], [place] [address] Lab: [phone] Dir: Brendia Sacks, MD, MMM  Professional component performed at [facility], [facility], [place], [place], [place] [address] Lab: [phone] Dir: Dellia Nims. Reuel Derby, MD       Assessment & Plan:   Problem List Items Addressed This Visit    None    Visit Diagnoses    Breast pain, left    -  Primary   Will change screening mammogram order to diagnostic given intermittent  breast pain. Nontender today, and exam benign. Await imaging results   Relevant Orders   MM DIAG BREAST TOMO BILATERAL       Follow up plan: Return for as scheduled.

## 2018-09-28 NOTE — Transfer of Care (Signed)
2Immediate Anesthesia Transfer of Care Note  Patient: Lauren Hughes  Procedure(s) Performed: COLONOSCOPY WITH PROPOFOL (N/A )  Patient Location: PACU and Endoscopy Unit  Anesthesia Type:General  Level of Consciousness: sedated  Airway & Oxygen Therapy: Patient Spontanous Breathing and Patient connected to nasal cannula oxygen  Post-op Assessment: Report given to RN and Post -op Vital signs reviewed and stable  Post vital signs: stable  Last Vitals:  Vitals Value Taken Time  BP 82/45 09/28/2018  9:48 AM  Temp    Pulse 73 09/28/2018  9:48 AM  Resp 18 09/28/2018  9:48 AM  SpO2 99 % 09/28/2018  9:48 AM  Vitals shown include unvalidated device data.  Last Pain:  Vitals:   09/28/18 0836  TempSrc: Tympanic  PainSc: 0-No pain         Complications: No apparent anesthesia complications
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
## 2018-09-28 NOTE — Patient Instructions (Signed)
Please call [facility] @ [phone] to schedule your mammogram.   

## 2018-09-28 NOTE — Anesthesia Post-op Follow-up Note (Signed)
Anesthesia QCDR form completed.        

## 2018-09-28 NOTE — Anesthesia Postprocedure Evaluation (Signed)
Anesthesia Post Note  Patient: Lauren Hughes  Procedure(s) Performed: COLONOSCOPY WITH PROPOFOL (N/A )  Patient location during evaluation: Endoscopy Anesthesia Type: General Level of consciousness: awake and alert Pain management: pain level controlled Vital Signs Assessment: post-procedure vital signs reviewed and stable Respiratory status: spontaneous breathing and respiratory function stable Cardiovascular status: stable Anesthetic complications: no     Last Vitals:  Vitals:   09/28/18 0836 09/28/18 0947  BP: (!) 133/95   Pulse: 92   Resp: 20   Temp: 36.9 C (!) 36.2 C  SpO2: 100%     Last Pain:  Vitals:   09/28/18 0947  TempSrc: Tympanic  PainSc:                  , K

## 2018-09-28 NOTE — Op Note (Signed)
[facility] Gastroenterology Patient Name: Lauren Hughes Procedure Date: 09/28/2018 9:25 AM MRN: [id number] Account #: [id number] Date of Birth: 28-Sep-1971 Admit Type: Outpatient Age: 47 Room: [facility] ENDO ROOM 3 Gender: Female Note Status: Finalized Procedure:            Colonoscopy Indications:          Screening for colorectal malignant neoplasm Providers:            Hae Ahlers B. Bonna Gains MD, MD Referring MD:         Volney American (Referring MD) Medicines:            Monitored Anesthesia Care Complications:        No immediate complications. Procedure:            Pre-Anesthesia Assessment:                       - ASA Grade Assessment: II - A patient with mild                        systemic disease.                       - Prior to the procedure, a History and Physical was                        performed, and patient medications, allergies and                        sensitivities were reviewed. The patient's tolerance of                        previous anesthesia was reviewed.                       - The risks and benefits of the procedure and the                        sedation options and risks were discussed with the                        patient. All questions were answered and informed                        consent was obtained.                       - Patient identification and proposed procedure were                        verified prior to the procedure by the physician, the                        nurse, the anesthesiologist, the anesthetist and the                        technician. The procedure was verified in the procedure                        room.  After obtaining informed consent, the colonoscope was                        passed under direct vision. Throughout the procedure,                        the patient's blood pressure, pulse, and oxygen                        saturations were monitored continuously. The                       Colonoscope was introduced through the anus and                        advanced to the the cecum, identified by appendiceal                        orifice and ileocecal valve. The colonoscopy was                        performed with ease. The patient tolerated the                        procedure well. The quality of the bowel preparation                        was good. Findings:      The perianal and digital rectal examinations were normal.      A 5 mm polyp was found in the sigmoid colon. The polyp was sessile. The       polyp was removed with a cold snare. Resection and retrieval were       complete.      A 2 mm polyp was found in the sigmoid colon. The polyp was sessile. The       polyp was removed with a cold biopsy forceps. Resection and retrieval       were complete.      Multiple diverticula were found in the sigmoid colon and ascending colon.      The exam was otherwise without abnormality.      The rectum, sigmoid colon, descending colon, transverse colon, ascending       colon and cecum appeared normal.      The retroflexed view of the distal rectum and anal verge was normal and       showed no anal or rectal abnormalities. Impression:           - One 5 mm polyp in the sigmoid colon, removed with a                        cold snare. Resected and retrieved.                       - One 2 mm polyp in the sigmoid colon, removed with a                        cold biopsy forceps. Resected and retrieved.                       - Diverticulosis in the sigmoid colon and in  the                        ascending colon.                       - The examination was otherwise normal.                       - The rectum, sigmoid colon, descending colon,                        transverse colon, ascending colon and cecum are normal.                       - The distal rectum and anal verge are normal on                        retroflexion view. Recommendation:       -  Discharge patient to home (with escort).                       - Advance diet as tolerated.                       - Continue present medications.                       - Await pathology results.                       - Repeat colonoscopy in 5 years if polyps show adenoma,                        10 years if they are hyperplastic.                       - The findings and recommendations were discussed with                        the patient.                       - The findings and recommendations were discussed with                        the patient's family.                       - Return to primary care physician as previously                        scheduled.                       - High fiber diet. Procedure Code(s):    --- Professional ---                       [phone], Colonoscopy, flexible; with removal of tumor(s),                        polyp(s), or other lesion(s) by snare technique  48472, 23, Colonoscopy, flexible; with biopsy, single                        or multiple Diagnosis Code(s):    --- Professional ---                       Z12.11, Encounter for screening for malignant neoplasm                        of colon                       D12.5, Benign neoplasm of sigmoid colon                       K57.30, Diverticulosis of large intestine without                        perforation or abscess without bleeding CPT copyright 2018 American Medical Association. All rights reserved. The codes documented in this report are preliminary and upon coder review may  be revised to meet current compliance requirements.  Vonda Antigua, MD Margretta Sidle B. Bonna Gains MD, MD 09/28/2018 9:49:19 AM This report has been signed electronically. Number of Addenda: 0 Note Initiated On: 09/28/2018 9:25 AM Scope Withdrawal Time: 0 hours 12 minutes 24 seconds  Total Procedure Duration: 0 hours 15 minutes 6 seconds  Estimated Blood Loss: Estimated blood loss: none.      [facility]

## 2018-09-28 NOTE — Anesthesia Preprocedure Evaluation (Addendum)
Anesthesia Evaluation  Patient identified by MRN, date of birth, ID band Patient awake    Reviewed: Allergy & Precautions, NPO status , Patient's Chart, lab work & pertinent test results  History of Anesthesia Complications Negative for: history of anesthetic complications  Airway Mallampati: III       Dental   Pulmonary neg sleep apnea, neg COPD, former smoker (Quit x 3 months),           Cardiovascular hypertension, Pt. on medications (-) Past MI and (-) CHF (-) dysrhythmias (-) Valvular Problems/Murmurs     Neuro/Psych neg Seizures Anxiety Depression    GI/Hepatic Neg liver ROS, neg GERD  ,  Endo/Other  neg diabetes  Renal/GU negative Renal ROS     Musculoskeletal   Abdominal   Peds  Hematology   Anesthesia Other Findings   Reproductive/Obstetrics                            Anesthesia Physical Anesthesia Plan  ASA: II  Anesthesia Plan: General   Post-op Pain Management:    Induction:   PONV Risk Score and Plan: 3 and Propofol infusion, TIVA and Midazolam  Airway Management Planned: Nasal Cannula  Additional Equipment:   Intra-op Plan:   Post-operative Plan:   Informed Consent: I have reviewed the patients History and Physical, chart, labs and discussed the procedure including the risks, benefits and alternatives for the proposed anesthesia with the patient or authorized representative who has indicated his/her understanding and acceptance.     Plan Discussed with:   Anesthesia Plan Comments:         Anesthesia Quick Evaluation

## 2018-09-29 ENCOUNTER — Encounter: Payer: Self-pay | Admitting: Gastroenterology

## 2018-09-29 LAB — SURGICAL PATHOLOGY

## 2018-10-03 ENCOUNTER — Encounter: Payer: Self-pay | Admitting: Gastroenterology

## 2018-10-03 ENCOUNTER — Telehealth: Payer: Self-pay

## 2018-10-03 NOTE — Telephone Encounter (Signed)
Pt called triage line and left msg that she would like her old mammogram results she had done here. Checked [facility] list and her name is on there already. Pt is aware [facility] has requested her information already and that we are waiting to copy images and send them. [facility] is out of the office today.

## 2018-10-05 ENCOUNTER — Other Ambulatory Visit: Payer: Self-pay | Admitting: Family Medicine

## 2018-10-06 ENCOUNTER — Other Ambulatory Visit: Payer: Self-pay | Admitting: Family Medicine

## 2018-10-06 DIAGNOSIS — N644 Mastodynia: Secondary | ICD-10-CM

## 2018-10-19 ENCOUNTER — Ambulatory Visit
Admission: RE | Admit: 2018-10-19 | Discharge: 2018-10-19 | Disposition: A | Discharge status: Home / Self Care | Payer: BLUE CROSS/BLUE SHIELD | Source: Ambulatory Visit | Attending: Family Medicine | Admitting: Family Medicine

## 2018-10-19 DIAGNOSIS — N644 Mastodynia: Secondary | ICD-10-CM

## 2018-12-05 ENCOUNTER — Encounter: Payer: Self-pay | Admitting: Family Medicine

## 2018-12-06 ENCOUNTER — Other Ambulatory Visit: Payer: Self-pay | Admitting: Family Medicine

## 2018-12-06 MED ORDER — BUPROPION HCL ER (XL) 150 MG PO TB24: 300.0000 mg | ORAL_TABLET | Freq: Every day | ORAL | 1 refills | Status: DC

## 2018-12-06 MED ORDER — AMLODIPINE BESYLATE 10 MG PO TABS: 10.0000 mg | ORAL_TABLET | Freq: Every day | ORAL | 1 refills | Status: DC

## 2018-12-06 MED ORDER — CLONIDINE HCL 0.1 MG PO TABS: 0.1000 mg | ORAL_TABLET | Freq: Every day | ORAL | 1 refills | Status: DC

## 2018-12-07 ENCOUNTER — Other Ambulatory Visit: Payer: Self-pay | Admitting: Family Medicine

## 2018-12-07 MED ORDER — VENLAFAXINE HCL ER 75 MG PO CP24: 75.0000 mg | ORAL_CAPSULE | Freq: Every day | ORAL | 0 refills | Status: DC

## 2019-01-09 ENCOUNTER — Encounter: Payer: Self-pay | Admitting: Family Medicine

## 2019-01-11 ENCOUNTER — Other Ambulatory Visit: Payer: Self-pay | Admitting: Family Medicine

## 2019-01-11 MED ORDER — BUPROPION HCL ER (XL) 150 MG PO TB24: 150.0000 mg | ORAL_TABLET | Freq: Every day | ORAL | 3 refills | Status: DC

## 2019-03-14 ENCOUNTER — Encounter: Payer: Self-pay | Admitting: Family Medicine

## 2019-03-14 ENCOUNTER — Other Ambulatory Visit: Payer: Self-pay

## 2019-03-14 ENCOUNTER — Ambulatory Visit (INDEPENDENT_AMBULATORY_CARE_PROVIDER_SITE_OTHER): Payer: BLUE CROSS/BLUE SHIELD | Admitting: Family Medicine

## 2019-03-14 VITALS — BP 132/86 | HR 78 | Temp 98.0°F

## 2019-03-14 DIAGNOSIS — F330 Major depressive disorder, recurrent, mild: Secondary | ICD-10-CM

## 2019-03-14 DIAGNOSIS — I10 Essential (primary) hypertension: Secondary | ICD-10-CM | POA: Diagnosis not present

## 2019-03-14 DIAGNOSIS — E785 Hyperlipidemia, unspecified: Secondary | ICD-10-CM

## 2019-03-14 MED ORDER — AMLODIPINE BESYLATE 10 MG PO TABS: 10.0000 mg | ORAL_TABLET | Freq: Every day | ORAL | 1 refills | Status: DC

## 2019-03-14 MED ORDER — CLONIDINE HCL 0.1 MG PO TABS: 0.1000 mg | ORAL_TABLET | Freq: Every day | ORAL | 1 refills | Status: DC

## 2019-03-14 MED ORDER — VITAMIN D (ERGOCALCIFEROL) 1.25 MG (50000 UNIT) PO CAPS: 50000.0000 [IU] | ORAL_CAPSULE | ORAL | 3 refills | Status: DC

## 2019-03-14 MED ORDER — BUPROPION HCL ER (XL) 300 MG PO TB24: 300.0000 mg | ORAL_TABLET | Freq: Every day | ORAL | 1 refills | Status: DC

## 2019-03-14 NOTE — Assessment & Plan Note (Signed)
Recheck lipids, continue good lifestyle modifications

## 2019-03-14 NOTE — Assessment & Plan Note (Signed)
Increase to 300 mg wellbutrin XL and continue to monitor. F/u if having any issues with moods

## 2019-03-14 NOTE — Assessment & Plan Note (Signed)
Stable and under good control per reported home readings, will obtain BP reading and HR next week when pt presents to clinic for labwork and adjust if needed based on that reading. Continue current regimen for now and good lifestyle modifications

## 2019-03-14 NOTE — Progress Notes (Signed)
There were no vitals taken for this visit.   Subjective:    Patient ID: Lauren Hughes, female    DOB: [birth year], 48 y.o.   MRN: [id number]  HPI: Lauren Hughes is a 48 y.o. female  Chief Complaint  Patient presents with  . Follow-up  . Medication Management  . Depression    Stopped effexor due to side effexor. Took about 1 week.     . This visit was completed via WebEx due to the restrictions of the COVID-19 pandemic. All issues as above were discussed and addressed. Physical exam was done as above through visual confirmation on WebEx. If it was felt that the patient should be evaluated in the office, they were directed there. The patient verbally consented to this visit. . Location of the patient: home . Location of the provider: home . Those involved with this call:  . Provider: Merrie Roof, PA-C . CMA: Gerda Diss, CMA . Front Desk/Registration: Jill Side  . Time spent on call: 25 minutes with patient face to face via video conference. More than 50% of this time was spent in counseling and coordination of care. 10 minutes total spent in review of patient's record and preparation of their chart.  I verified patient identity using two factors (patient name and date of birth). Patient consents verbally to being seen via telemedicine visit today.   Presenting today for 6 month f/u chronic conditions.   Htn - home BPs running 130s/70s, checking about 1/week at her parents house as she doesn't have a home monitor. Denies side effects, currently taking amlodipine and clonidine once daily. No CP, SOB, HAs, dizziness.   Not currently on therapy for cholesterol management, working on lifestyle modifications and has been able to reduce levels quite a bit that way. She's hoping to stay off medications for now.   Taking the wellbutrin XL again as effexor caused side effects that became intolerable after about a week. Currently on 150 mg wellbutrin XL, felt she did better on the 300 mg  dose. Denies SI/HI.   Depression screen [facility] 2/9 03/14/2019 06/08/2018 02/09/2018  Decreased Interest 1 - 1  Down, Depressed, Hopeless 1 - 1  PHQ - 2 Score 2 - 2  Altered sleeping 1 1 1   Tired, decreased energy 1 1 1   Change in appetite 1 0 1  Feeling bad or failure about yourself  0 0 0  Trouble concentrating 0 0 0  Moving slowly or fidgety/restless 0 0 0  Suicidal thoughts 0 0 0  PHQ-9 Score 5 - 5  Difficult doing work/chores Not difficult at all - -   Not taking her weekly vit D tablet. Last vit D level drawn in 2019 was at 8.3. Stopped taking it about 4-5 months ago, no reason why just never refilled it. Not taking any OTC supplements or vitamins.     Relevant past medical, surgical, family and social history reviewed and updated as indicated. Interim medical history since our last visit reviewed. Allergies and medications reviewed and updated.  Review of Systems  Per HPI unless specifically indicated above     Objective:    There were no vitals taken for this visit.  Wt Readings from Last 3 Encounters:  09/28/18 199 lb 14.4 oz (90.7 kg)  09/28/18 200 lb (90.7 kg)  06/08/18 199 lb (90.3 kg)    Physical Exam Vitals signs and nursing note reviewed.  Constitutional:      General: She is not in acute distress.  Appearance: Normal appearance.  HENT:     Head: Atraumatic.     Right Ear: External ear normal.     Left Ear: External ear normal.     Nose: Nose normal. No congestion.     Mouth/Throat:     Mouth: Mucous membranes are moist.     Pharynx: Oropharynx is clear. No posterior oropharyngeal erythema.  Eyes:     Extraocular Movements: Extraocular movements intact.     Conjunctiva/sclera: Conjunctivae normal.  Neck:     Musculoskeletal: Normal range of motion.  Cardiovascular:     Comments: Unable to assess via virtual visit Pulmonary:     Effort: Pulmonary effort is normal. No respiratory distress.  Musculoskeletal: Normal range of motion.  Skin:    General:  Skin is dry.     Findings: No erythema.  Neurological:     Mental Status: She is alert and oriented to person, place, and time.  Psychiatric:        Mood and Affect: Mood normal.        Thought Content: Thought content normal.        Judgment: Judgment normal.     Results for orders placed or performed during the hospital encounter of 09/28/18  Pregnancy, urine POC  Result Value Ref Range   Preg Test, Ur NEGATIVE NEGATIVE  Surgical pathology  Result Value Ref Range   SURGICAL PATHOLOGY      Surgical Pathology CASE: [id number] PATIENT: [facility] Surgical Pathology Report     SPECIMEN SUBMITTED: A. Colon polyp x2, sigmoid; cold snare/cbx  CLINICAL HISTORY: None provided  PRE-OPERATIVE DIAGNOSIS: Family history of colon cancer, screening colonoscopy  POST-OPERATIVE DIAGNOSIS: Diverticulosis; polyps     DIAGNOSIS: A. COLON POLYPS X2, SIGMOID; COLD SNARE/BIOPSY: - HYPERPLASTIC POLYPS (X2). - NEGATIVE FOR DYSPLASIA AND MALIGNANCY.   GROSS DESCRIPTION: A. Labeled: Cold snare/cbx sigmoid colon polyp x2 Received: In formalin Tissue fragment(s): 2 Size: 0.3 and 0.5 cm Description: Pink-tan fragments Entirely submitted in one cassette.    Final Diagnosis performed by Allena Napoleon, MD.   Electronically signed 09/29/2018 11:59:50AM The electronic signature indicates that the named Attending Pathologist has evaluated the specimen  Technical component performed at [facility], [address], [place], [place] [address] Lab: [phone] Dir: Brendia Sacks, MD, MMM  Professional component performed at [facility], [facility], [place], [place], [place] [address] Lab: [phone] Dir: Dellia Nims. Rubinas, MD       Assessment & Plan:   Problem List Items Addressed This Visit      Cardiovascular and Mediastinum   Hypertension - Primary    Stable and under good control per reported home readings, will obtain BP reading and HR next week when  pt presents to clinic for labwork and adjust if needed based on that reading. Continue current regimen for now and good lifestyle modifications      Relevant Medications   amLODipine (NORVASC) 10 MG tablet   cloNIDine (CATAPRES) 0.1 MG tablet   Other Relevant Orders   Comprehensive metabolic panel     Other   Dyslipidemia    Recheck lipids, continue good lifestyle modifications      Relevant Orders   Lipid Panel w/o Chol/HDL Ratio   Mild episode of recurrent major depressive disorder (HCC)    Increase to 300 mg wellbutrin XL and continue to monitor. F/u if having any issues with moods      Relevant Medications   buPROPion (WELLBUTRIN XL) 300 MG 24 hr tablet  Follow up plan: Return in about 6 months (around 09/13/2019) for CPE.

## 2019-03-21 ENCOUNTER — Other Ambulatory Visit: Payer: Self-pay

## 2019-03-21 ENCOUNTER — Other Ambulatory Visit: Payer: BLUE CROSS/BLUE SHIELD

## 2019-03-21 DIAGNOSIS — I10 Essential (primary) hypertension: Secondary | ICD-10-CM

## 2019-03-21 DIAGNOSIS — E785 Hyperlipidemia, unspecified: Secondary | ICD-10-CM

## 2019-03-22 LAB — COMPREHENSIVE METABOLIC PANEL
ALT: 13 IU/L (ref 0–32)
AST: 12 IU/L (ref 0–40)
Albumin/Globulin Ratio: 1.6 (ref 1.2–2.2)
Albumin: 4.1 g/dL (ref 3.8–4.8)
Alkaline Phosphatase: 70 IU/L (ref 39–117)
BUN/Creatinine Ratio: 14 (ref 9–23)
BUN: 10 mg/dL (ref 6–24)
Bilirubin Total: 0.5 mg/dL (ref 0.0–1.2)
CO2: 24 mmol/L (ref 20–29)
Calcium: 9.5 mg/dL (ref 8.7–10.2)
Chloride: 102 mmol/L (ref 96–106)
Creatinine, Ser: 0.72 mg/dL (ref 0.57–1.00)
GFR calc Af Amer: 116 mL/min/{1.73_m2} (ref >59)
GFR calc non Af Amer: 101 mL/min/{1.73_m2} (ref >59)
Globulin, Total: 2.5 g/dL (ref 1.5–4.5)
Glucose: 117 mg/dL — ABNORMAL HIGH (ref 65–99)
Potassium: 4.7 mmol/L (ref 3.5–5.2)
Sodium: 139 mmol/L (ref 134–144)
Total Protein: 6.6 g/dL (ref 6.0–8.5)

## 2019-03-22 LAB — LIPID PANEL W/O CHOL/HDL RATIO
Cholesterol, Total: 209 mg/dL — ABNORMAL HIGH (ref 100–199)
HDL: 62 mg/dL (ref >39)
LDL Calculated: 135 mg/dL — ABNORMAL HIGH (ref 0–99)
Triglycerides: 58 mg/dL (ref 0–149)
VLDL Cholesterol Cal: 12 mg/dL (ref 5–40)

## 2019-03-24 ENCOUNTER — Encounter: Payer: Self-pay | Admitting: Family Medicine

## 2019-03-24 ENCOUNTER — Other Ambulatory Visit: Payer: Self-pay | Admitting: Family Medicine

## 2019-03-24 MED ORDER — BUPROPION HCL ER (XL) 150 MG PO TB24: 150.0000 mg | ORAL_TABLET | Freq: Two times a day (BID) | ORAL | 2 refills | Status: DC

## 2019-05-29 ENCOUNTER — Encounter: Payer: Self-pay | Admitting: Family Medicine

## 2019-05-29 MED ORDER — BUPROPION HCL ER (XL) 150 MG PO TB24: 150.0000 mg | ORAL_TABLET | Freq: Two times a day (BID) | ORAL | 1 refills | Status: DC

## 2019-05-29 NOTE — Telephone Encounter (Signed)
[place] to discuss. They did not see where pt had picked up 90 tablets of Wellbutrin. Did verify with them the Wellbutrin RX they had on file. Then called pt who stated she had gotten confused. RX of 90 she picked up was for clonidine not Wellbutrin. Pt would like 90 day supply of Wellbutrin sent in to [facility] however vs the 30 day supply. Please advise.

## 2019-06-12 ENCOUNTER — Encounter: Payer: Self-pay | Admitting: Family Medicine

## 2019-06-12 ENCOUNTER — Ambulatory Visit (INDEPENDENT_AMBULATORY_CARE_PROVIDER_SITE_OTHER): Payer: Self-pay | Admitting: Family Medicine

## 2019-06-12 ENCOUNTER — Other Ambulatory Visit: Payer: Self-pay

## 2019-06-12 DIAGNOSIS — H1031 Unspecified acute conjunctivitis, right eye: Secondary | ICD-10-CM

## 2019-06-12 MED ORDER — POLYMYXIN B-TRIMETHOPRIM 10000-0.1 UNIT/ML-% OP SOLN: 1.0000 [drp] | Freq: Four times a day (QID) | OPHTHALMIC | 0 refills | Status: DC

## 2019-06-12 NOTE — Progress Notes (Signed)
There were no vitals taken for this visit.   Subjective:    Patient ID: Lauren Hughes, female    DOB: [birth year], 48 y.o.   MRN: [id number]  HPI: Lauren Hughes is a 48 y.o. female  Chief Complaint  Patient presents with  . Conjunctivitis    Right eye, x 3 days. Causing blurred vision    . This visit was completed via WebEx due to the restrictions of the COVID-19 pandemic. All issues as above were discussed and addressed. Physical exam was done as above through visual confirmation on WebEx. If it was felt that the patient should be evaluated in the office, they were directed there. The patient verbally consented to this visit. . Location of the patient: in parked car . Location of the provider: home . Those involved with this call:  . Provider: Merrie Roof, PA-C . CMA: Gerda Diss, CMA . Front Desk/Registration: Jill Side  . Time spent on call: 15 minutes with patient face to face via video conference. More than 50% of this time was spent in counseling and coordination of care. 5 minutes total spent in review of patient's record and preparation of their chart. I verified patient identity using two factors (patient name and date of birth). Patient consents verbally to being seen via telemedicine visit today.   Presenting today for about 3 days of right eye redness, irritation, and thick yellow drainage. Denies HAs, fevers, dizziness, temple pain, eye pressure, vision changes, N/V. Trying visine drops with no relief. No sick contacts.   Relevant past medical, surgical, family and social history reviewed and updated as indicated. Interim medical history since our last visit reviewed. Allergies and medications reviewed and updated.  Review of Systems  Per HPI unless specifically indicated above     Objective:    There were no vitals taken for this visit.  Wt Readings from Last 3 Encounters:  09/28/18 199 lb 14.4 oz (90.7 kg)  09/28/18 200 lb (90.7 kg)  06/08/18 199 lb  (90.3 kg)    Physical Exam Vitals signs and nursing note reviewed.  Constitutional:      General: She is not in acute distress.    Appearance: Normal appearance.  HENT:     Head: Atraumatic.     Right Ear: External ear normal.     Left Ear: External ear normal.     Nose: Nose normal. No congestion.     Mouth/Throat:     Mouth: Mucous membranes are moist.     Pharynx: Oropharynx is clear. No posterior oropharyngeal erythema.  Eyes:     Extraocular Movements: Extraocular movements intact.     Comments: Right eye erythematous with small amount of drainage present  Neck:     Musculoskeletal: Normal range of motion.  Cardiovascular:     Comments: Unable to assess via virtual visit Pulmonary:     Effort: Pulmonary effort is normal. No respiratory distress.  Musculoskeletal: Normal range of motion.  Skin:    General: Skin is dry.     Findings: No erythema.  Neurological:     Mental Status: She is alert and oriented to person, place, and time.  Psychiatric:        Mood and Affect: Mood normal.        Thought Content: Thought content normal.        Judgment: Judgment normal.     Results for orders placed or performed in visit on 03/21/19  Lipid Panel w/o Chol/HDL Ratio  Result  Value Ref Range   Cholesterol, Total 209 (H) 100 - 199 mg/dL   Triglycerides 58 0 - 149 mg/dL   HDL 62 >39 mg/dL   VLDL Cholesterol Cal 12 5 - 40 mg/dL   LDL Calculated 135 (H) 0 - 99 mg/dL  Comprehensive metabolic panel  Result Value Ref Range   Glucose 117 (H) 65 - 99 mg/dL   BUN 10 6 - 24 mg/dL   Creatinine, Ser 0.72 0.57 - 1.00 mg/dL   GFR calc non Af Amer 101 >59 mL/min/1.73   GFR calc Af Amer 116 >59 mL/min/1.73   BUN/Creatinine Ratio 14 9 - 23   Sodium 139 134 - 144 mmol/L   Potassium 4.7 3.5 - 5.2 mmol/L   Chloride 102 96 - 106 mmol/L   CO2 24 20 - 29 mmol/L   Calcium 9.5 8.7 - 10.2 mg/dL   Total Protein 6.6 6.0 - 8.5 g/dL   Albumin 4.1 3.8 - 4.8 g/dL   Globulin, Total 2.5 1.5 - 4.5  g/dL   Albumin/Globulin Ratio 1.6 1.2 - 2.2   Bilirubin Total 0.5 0.0 - 1.2 mg/dL   Alkaline Phosphatase 70 39 - 117 IU/L   AST 12 0 - 40 IU/L   ALT 13 0 - 32 IU/L      Assessment & Plan:   Problem List Items Addressed This Visit    None    Visit Diagnoses    Acute bacterial conjunctivitis of right eye    -  Primary   Tx with polytrim drops, warm compresses. Stay home until sxs resolved. Strict return precautions given, f/u with Eye doctor if not resolving       Follow up plan: Return if symptoms worsen or fail to improve.

## 2019-06-12 NOTE — Telephone Encounter (Signed)
FYI only. Pt scheduled for 230

## 2019-08-07 ENCOUNTER — Other Ambulatory Visit: Payer: Self-pay | Admitting: Family Medicine

## 2019-08-15 ENCOUNTER — Other Ambulatory Visit: Payer: Self-pay | Admitting: Family Medicine

## 2019-08-15 NOTE — Telephone Encounter (Signed)
Requested medications are due for refill today?  No  Requested medications are on the active medication list?  Yes  Last refill - 03/14/2019, #12, 3 refills  Future visit scheduled?  Yes- 09/18/2019  Notes to clinic   Requested Prescriptions  Pending Prescriptions Disp Refills   Vitamin D, Ergocalciferol, (DRISDOL) 1.25 MG (50000 UT) CAPS capsule [Pharmacy Med Name: Vitamin D (Ergocalciferol) 50000 UNIT Oral Capsule] 12 capsule 0    Sig: Take 1 capsule by mouth once a week     Endocrinology:  Vitamins - Vitamin D Supplementation Failed - 08/15/2019  3:57 PM      Failed - 50,000 IU strengths are not delegated      Failed - Phosphate in normal range and within 360 days    No results found for: PHOS       Failed - Vitamin D in normal range and within 360 days    Vit D, 25-Hydroxy  Date Value Ref Range Status  02/09/2018 8.3 (L) 30.0 - 100.0 ng/mL Final    Comment:    Vitamin D deficiency has been defined by the Institute of Medicine and an Endocrine Society practice guideline as a level of serum 25-OH vitamin D less than 20 ng/mL (1,2). The Endocrine Society went on to further define vitamin D insufficiency as a level between 21 and 29 ng/mL (2). 1. IOM (Institute of Medicine). 2010. Dietary reference    intakes for calcium and D. [place]: The    [employer]. 2. Holick MF, Binkley [place], Bischoff-Ferrari HA, et al.    Evaluation, treatment, and prevention of vitamin D    deficiency: an Endocrine Society clinical practice    guideline. JCEM. 2011 Jul; 96(7):1911-30.          Passed - Ca in normal range and within 360 days    Calcium  Date Value Ref Range Status  03/21/2019 9.5 8.7 - 10.2 mg/dL Final         Passed - Valid encounter within last 12 months    Recent Outpatient Visits          2 months ago Acute bacterial conjunctivitis of right eye   [facility], [place]   5 months ago Essential hypertension   [place], [place], [place]   10 months ago Breast pain, left   [facility], L[facility], [place]   1 year ago Essential hypertension   [facility] Merrie Roof [place], [place]   1 year ago Fatigue, unspecified type   [facility], [place], [place]      Future Appointments            In 1 month [facility], [place], [place], [place]

## 2019-08-15 NOTE — Telephone Encounter (Signed)
Routing to provider  

## 2019-09-08 ENCOUNTER — Other Ambulatory Visit: Payer: Self-pay | Admitting: Family Medicine

## 2019-09-15 ENCOUNTER — Encounter: Payer: Self-pay | Admitting: Family Medicine

## 2019-09-18 ENCOUNTER — Encounter: Payer: BLUE CROSS/BLUE SHIELD | Admitting: Family Medicine

## 2019-10-03 ENCOUNTER — Ambulatory Visit: Payer: BC Managed Care – PPO | Admitting: Family Medicine

## 2019-10-03 ENCOUNTER — Ambulatory Visit (INDEPENDENT_AMBULATORY_CARE_PROVIDER_SITE_OTHER): Payer: BC Managed Care – PPO | Admitting: Family Medicine

## 2019-10-03 ENCOUNTER — Other Ambulatory Visit: Payer: Self-pay

## 2019-10-03 DIAGNOSIS — E785 Hyperlipidemia, unspecified: Secondary | ICD-10-CM

## 2019-10-03 DIAGNOSIS — F330 Major depressive disorder, recurrent, mild: Secondary | ICD-10-CM | POA: Diagnosis not present

## 2019-10-03 DIAGNOSIS — Z720 Tobacco use: Secondary | ICD-10-CM | POA: Diagnosis not present

## 2019-10-03 DIAGNOSIS — I10 Essential (primary) hypertension: Secondary | ICD-10-CM | POA: Diagnosis not present

## 2019-10-03 DIAGNOSIS — E559 Vitamin D deficiency, unspecified: Secondary | ICD-10-CM

## 2019-10-03 MED ORDER — VITAMIN D (ERGOCALCIFEROL) 1.25 MG (50000 UNIT) PO CAPS: 50000.0000 [IU] | ORAL_CAPSULE | ORAL | 3 refills | Status: DC

## 2019-10-03 MED ORDER — AMLODIPINE BESYLATE 10 MG PO TABS: 10.0000 mg | ORAL_TABLET | Freq: Every day | ORAL | 1 refills | Status: DC

## 2019-10-03 MED ORDER — CLONIDINE HCL 0.1 MG PO TABS: 0.1000 mg | ORAL_TABLET | Freq: Every day | ORAL | 1 refills | Status: DC

## 2019-10-03 NOTE — Progress Notes (Signed)
There were no vitals taken for this visit.   Subjective:    Patient ID: Lauren Hughes, female    DOB: 10/31/1971, 48 y.o.   MRN: [id number]  HPI: Lauren Hughes is a 48 y.o. female  Chief Complaint  Patient presents with  . Depression  . Hypertension    . This visit was completed via WebEx due to the restrictions of the COVID-19 pandemic. All issues as above were discussed and addressed. Physical exam was done as above through visual confirmation on WebEx. If it was felt that the patient should be evaluated in the office, they were directed there. The patient verbally consented to this visit. . Location of the patient: home . Location of the provider: home . Those involved with this call:  . Provider: Merrie Roof, PA-C . CMA: Yvonna Alanis, [place] . Front Desk/Registration: Jill Side  . Time spent on call: 25 minutes with patient face to face via video conference. More than 50% of this time was spent in counseling and coordination of care. 5 minutes total spent in review of patient's record and preparation of their chart. I verified patient identity using two factors (patient name and date of birth). Patient consents verbally to being seen via telemedicine visit today.   HTN - Home BPs have been around 130s/70s. Taking medicines faithfully without side effects. Denies CP, SOB, HAs, dizziness. Has not been exercising or watching her diet lately and states she thinks she's gained some weight.   Depression - has been off buproprion for about 2 months and so far doing very well off. Wanting to remain off for now.   HLD - taking garlic supplements. Does not follow strict diet or exercise plan. Denies CP, SOB, claudication.   Smoking - quit about a year ago, does not have any craving issues anymore. Does note increased appetite since quitting but otherwise feels really good about things.   Vit D def - taking weekly supplement faithfully, tolerating it well.   Depression screen  [facility] 2/9 10/03/2019 03/14/2019 06/08/2018  Decreased Interest 0 1 -  Down, Depressed, Hopeless 0 1 -  PHQ - 2 Score 0 2 -  Altered sleeping 0 1 1  Tired, decreased energy 0 1 1  Change in appetite 0 1 0  Feeling bad or failure about yourself  0 0 0  Trouble concentrating 0 0 0  Moving slowly or fidgety/restless 0 0 0  Suicidal thoughts 0 0 0  PHQ-9 Score 0 5 -  Difficult doing work/chores Not difficult at all Not difficult at all -    Relevant past medical, surgical, family and social history reviewed and updated as indicated. Interim medical history since our last visit reviewed. Allergies and medications reviewed and updated.  Review of Systems  Per HPI unless specifically indicated above     Objective:    There were no vitals taken for this visit.  Wt Readings from Last 3 Encounters:  09/28/18 199 lb 14.4 oz (90.7 kg)  09/28/18 200 lb (90.7 kg)  06/08/18 199 lb (90.3 kg)    Physical Exam Vitals signs and nursing note reviewed.  Constitutional:      General: She is not in acute distress.    Appearance: Normal appearance.  HENT:     Head: Atraumatic.     Right Ear: External ear normal.     Left Ear: External ear normal.     Nose: Nose normal. No congestion.     Mouth/Throat:  Mouth: Mucous membranes are moist.     Pharynx: Oropharynx is clear. No posterior oropharyngeal erythema.  Eyes:     Extraocular Movements: Extraocular movements intact.     Conjunctiva/sclera: Conjunctivae normal.  Neck:     Musculoskeletal: Normal range of motion.  Cardiovascular:     Comments: Unable to assess via virtual visit Pulmonary:     Effort: Pulmonary effort is normal. No respiratory distress.  Musculoskeletal: Normal range of motion.  Skin:    General: Skin is dry.     Findings: No erythema.  Neurological:     Mental Status: She is alert and oriented to person, place, and time.  Psychiatric:        Mood and Affect: Mood normal.        Thought Content: Thought content  normal.        Judgment: Judgment normal.     Results for orders placed or performed in visit on 03/21/19  Lipid Panel w/o Chol/HDL Ratio  Result Value Ref Range   Cholesterol, Total 209 (H) 100 - 199 mg/dL   Triglycerides 58 0 - 149 mg/dL   HDL 62 >39 mg/dL   VLDL Cholesterol Cal 12 5 - 40 mg/dL   LDL Calculated 135 (H) 0 - 99 mg/dL  Comprehensive metabolic panel  Result Value Ref Range   Glucose 117 (H) 65 - 99 mg/dL   BUN 10 6 - 24 mg/dL   Creatinine, Ser 0.72 0.57 - 1.00 mg/dL   GFR calc non Af Amer 101 >59 mL/min/1.73   GFR calc Af Amer 116 >59 mL/min/1.73   BUN/Creatinine Ratio 14 9 - 23   Sodium 139 134 - 144 mmol/L   Potassium 4.7 3.5 - 5.2 mmol/L   Chloride 102 96 - 106 mmol/L   CO2 24 20 - 29 mmol/L   Calcium 9.5 8.7 - 10.2 mg/dL   Total Protein 6.6 6.0 - 8.5 g/dL   Albumin 4.1 3.8 - 4.8 g/dL   Globulin, Total 2.5 1.5 - 4.5 g/dL   Albumin/Globulin Ratio 1.6 1.2 - 2.2   Bilirubin Total 0.5 0.0 - 1.2 mg/dL   Alkaline Phosphatase 70 39 - 117 IU/L   AST 12 0 - 40 IU/L   ALT 13 0 - 32 IU/L      Assessment & Plan:   Problem List Items Addressed This Visit      Cardiovascular and Mediastinum   Hypertension - Primary    She is unable to obtain a BP reading today, but recent readings WNL when able to be taken. Continue current regimen, will obtain a reading when she presents for lab work in near future      Relevant Medications   amLODipine (NORVASC) 10 MG tablet   cloNIDine (CATAPRES) 0.1 MG tablet   Other Relevant Orders   Comprehensive metabolic panel     Other   Tobacco use    In remission for 1 year, congratulated success and encouraged continued cessation      Dyslipidemia    Recheck lipids, work on diet and exercise modifications, continue OTC supplements.       Relevant Orders   Lipid Panel w/o Chol/HDL Ratio out   Mild episode of recurrent major depressive disorder ([place])    Took herself off wellbutrin 2 months ago and doing well, wishes to stay  off for now. Will call if feeling she needs to go back on.        Other Visit Diagnoses    Vitamin D deficiency  Relevant Orders   Vit D  25 hydroxy (rtn osteoporosis monitoring)       Follow up plan: Return in about 6 months (around 04/01/2020) for CPE.

## 2019-10-04 NOTE — Assessment & Plan Note (Signed)
Took herself off wellbutrin 2 months ago and doing well, wishes to stay off for now. Will call if feeling she needs to go back on.

## 2019-10-04 NOTE — Assessment & Plan Note (Signed)
Recheck lipids, work on diet and exercise modifications, continue OTC supplements.

## 2019-10-04 NOTE — Assessment & Plan Note (Signed)
She is unable to obtain a BP reading today, but recent readings WNL when able to be taken. Continue current regimen, will obtain a reading when she presents for lab work in near future

## 2019-10-04 NOTE — Assessment & Plan Note (Signed)
In remission for 1 year, congratulated success and encouraged continued cessation

## 2019-11-01 ENCOUNTER — Encounter: Payer: Self-pay | Admitting: Family Medicine

## 2019-11-01 ENCOUNTER — Telehealth: Payer: Self-pay | Admitting: Family Medicine

## 2019-11-01 NOTE — Telephone Encounter (Signed)
Called pt to schedule 6 month CPE, no answer, left vm, sending letter

## 2019-11-13 ENCOUNTER — Encounter: Payer: Self-pay | Admitting: Family Medicine

## 2020-04-17 ENCOUNTER — Other Ambulatory Visit: Payer: Self-pay

## 2020-04-17 ENCOUNTER — Ambulatory Visit (INDEPENDENT_AMBULATORY_CARE_PROVIDER_SITE_OTHER): Payer: BC Managed Care – PPO | Admitting: Adult Health

## 2020-04-17 ENCOUNTER — Encounter: Payer: Self-pay | Admitting: Adult Health

## 2020-04-17 ENCOUNTER — Other Ambulatory Visit: Payer: Self-pay | Admitting: Adult Health

## 2020-04-17 VITALS — BP 120/78 | HR 87 | Temp 96.6°F | Resp 16 | Ht 65.0 in | Wt 222.0 lb

## 2020-04-17 DIAGNOSIS — F339 Major depressive disorder, recurrent, unspecified: Secondary | ICD-10-CM

## 2020-04-17 DIAGNOSIS — I10 Essential (primary) hypertension: Secondary | ICD-10-CM | POA: Diagnosis not present

## 2020-04-17 DIAGNOSIS — E559 Vitamin D deficiency, unspecified: Secondary | ICD-10-CM

## 2020-04-17 DIAGNOSIS — Z0000 Encounter for general adult medical examination without abnormal findings: Secondary | ICD-10-CM | POA: Diagnosis not present

## 2020-04-17 DIAGNOSIS — E785 Hyperlipidemia, unspecified: Secondary | ICD-10-CM | POA: Diagnosis not present

## 2020-04-17 DIAGNOSIS — Z945 Skin transplant status: Secondary | ICD-10-CM

## 2020-04-17 DIAGNOSIS — N926 Irregular menstruation, unspecified: Secondary | ICD-10-CM

## 2020-04-17 DIAGNOSIS — Z1231 Encounter for screening mammogram for malignant neoplasm of breast: Secondary | ICD-10-CM

## 2020-04-17 DIAGNOSIS — R82998 Other abnormal findings in urine: Secondary | ICD-10-CM | POA: Diagnosis not present

## 2020-04-17 LAB — POCT URINALYSIS DIPSTICK
Glucose, UA: NEGATIVE
Nitrite, UA: NEGATIVE
Protein, UA: POSITIVE — AB
Spec Grav, UA: 1.025 (ref 1.010–1.025)
Urobilinogen, UA: 0.2 E.U./dL
pH, UA: 6 (ref 5.0–8.0)

## 2020-04-17 MED ORDER — AMLODIPINE BESYLATE 10 MG PO TABS: 10.0000 mg | ORAL_TABLET | Freq: Every day | ORAL | 1 refills | Status: DC

## 2020-04-17 MED ORDER — CLONIDINE HCL 0.1 MG PO TABS: 0.1000 mg | ORAL_TABLET | Freq: Every day | ORAL | 1 refills | Status: DC

## 2020-04-17 MED ORDER — BUPROPION HCL ER (XL) 150 MG PO TB24: 150.0000 mg | ORAL_TABLET | Freq: Every day | ORAL | 0 refills | Status: DC

## 2020-04-17 NOTE — Progress Notes (Signed)
New patient visit   Patient: Lauren Hughes   DOB: 1970-12-14   49 y.o. Female  MRN: [id number] Visit Date: 04/17/2020  Today's healthcare provider: Marcille Buffy, FNP   Chief Complaint  Patient presents with  . New Patient (Initial Visit)   Subjective    Lauren Hughes is a 49 y.o. female who presents today as a new patient to establish care.  HPI  Patient comes to our office to establish care she comes to [place] from [facility]. Patient reports that she feels well today and has no concerns to address, patient reports that she has her yearly gyn exams through [facility]. Patient reports that her diet is poor but she is working on improving and is trying to stay active by walking daily. Patient reports that her sleep patterns are poor and states that it has been so since she discontinued Wellbutrin. Patient states since discontinuing Wellbutrin she has experienced fatigue.  Last Pap 11/18/17- Negative Last Mammogram ( diagnostic bilateral with CAD and TOMO 10/19/18 Bi rads:1 negative Last Colonoscopy 09/28/18 2 polpys found and removed repeat 49yrs at age 68 she reports. She denies any rectal pain or pressure. She has no issues with rectal bleeding, dark or tarry stools.   She is fatigued. She works 2nd and half third shift.  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   Irregular menstrual cycle for 1 year. - regular cycle lasting 4 to 5 days. She has more cramping. Vaginal dryness, denies s any abnormal discharge.  LMP 04/03/20  dneies any irregular bleeding or spotting in between.,    Skin graft 2001 to right arm - she got caught in a slasher machine and had to have repair and skin graft.   Right knee had kneee surgery for torn meniscus. Dr. Posey Pronto - [place].    Quit smoking 2 years ago.   Patient  denies any fever, body aches,chills, rash, chest pain, shortness of breath, nausea, vomiting, or diarrhea.   Past Medical History:  Diagnosis Date  . Anxiety   .  Degloving injury of arm 2001  . Depression   . Dyslipidemia   . Hypertension   . IFG (impaired fasting glucose)   . Obesity   . Tobacco use    Past Surgical History:  Procedure Laterality Date  . CHONDROPLASTY Right 10/26/2017   Procedure: CHONDROPLASTY;  Surgeon: Leim Fabry, MD;  Location: [facility];  Service: Orthopedics;  Laterality: Right;  . COLONOSCOPY WITH PROPOFOL N/A 09/28/2018   Procedure: COLONOSCOPY WITH PROPOFOL;  Surgeon: Virgel Manifold, MD;  Location: [facility];  Service: Endoscopy;  Laterality: N/A;  . FOREARM FRACTURE SURGERY Right 2001  . MENISECTOMY Right 10/26/2017   Procedure: MENISECTOMY PARTIAL MEDIAL;  Surgeon: Leim Fabry, MD;  Location: [facility];  Service: Orthopedics;  Laterality: Right;  . TUBAL LIGATION     Family Status  Relation Name Status  . Mother  Alive  . Father  Alive  . MGF  (Not Specified)  . Neg Hx  (Not Specified)   Family History  Problem Relation Age of Onset  . Anemia Mother   . Diabetes Mother   . Hyperlipidemia Mother   . Hypertension Mother   . Stroke Mother   . Hypertension Father   . Stroke Father   . Cancer Maternal Grandfather        colon?  . Breast cancer Neg Hx    Social History   Socioeconomic History  . Marital status: Married  Spouse name: Not on file  . Number of children: Not on file  . Years of education: Not on file  . Highest education level: Not on file  Occupational History  . Not on file  Tobacco Use  . Smoking status: Former Smoker    Packs/day: 0.50    Years: 20.00    Pack years: 10.00    Types: Cigars    Quit date: 06/28/2018    Years since quitting: 1.8  . Smokeless tobacco: Never Used  Substance and Sexual Activity  . Alcohol use: Yes    Alcohol/week: 14.0 standard drinks    Types: 14 Cans of beer per week    Comment:    . Drug use: No  . Sexual activity: Yes    Birth control/protection: None  Other Topics Concern  . Not on file  Social History  Narrative  . Not on file   Social Determinants of Health   Financial Resource Strain:   . Difficulty of Paying Living Expenses:   Food Insecurity:   . Worried About Charity fundraiser in the Last Year:   . Arboriculturist in the Last Year:   Transportation Needs:   . Film/video editor (Medical):   Marland Kitchen Lack of Transportation (Non-Medical):   Physical Activity:   . Days of Exercise per Week:   . Minutes of Exercise per Session:   Stress:   . Feeling of Stress :   Social Connections:   . Frequency of Communication with Friends and Family:   . Frequency of Social Gatherings with Friends and Family:   . Attends Religious Services:   . Active Member of Clubs or Organizations:   . Attends Archivist Meetings:   Marland Kitchen Marital Status:    Outpatient Medications Prior to Visit  Medication Sig  . amLODipine (NORVASC) 10 MG tablet Take 1 tablet (10 mg total) by mouth daily.  . cloNIDine (CATAPRES) 0.1 MG tablet Take 1 tablet (0.1 mg total) by mouth daily.  . Vitamin D, Ergocalciferol, (DRISDOL) 1.25 MG (50000 UT) CAPS capsule Take 1 capsule (50,000 Units total) by mouth once a week.  . Caffeine (KEEP ALERT PO) Take 1 capsule by mouth daily as needed (Jet Alert).   No facility-administered medications prior to visit.   Allergies  Allergen Reactions  . Hydrochlorothiazide Other (See Comments)    Leg cramping  . Lisinopril Other (See Comments)    Dry cough  . Losartan Other (See Comments)    Leg cramping and spasms  . Metoprolol Other (See Comments)    Leg cramping    Immunization History  Administered Date(s) Administered  . Pneumococcal Polysaccharide-23 08/23/2013  . Tdap 09/03/2016    Health Maintenance  Topic Date Due  . COVID-19 Vaccine (1) Never done  . INFLUENZA VACCINE  06/23/2020  . MAMMOGRAM  10/19/2020  . PAP SMEAR-Modifier  11/18/2020  . TETANUS/TDAP  09/03/2026  . HIV Screening  Completed    Patient Care Team: Lycan Davee, Kelby Aline, FNP as PCP -  General (Family Medicine)  Review of Systems  Constitutional: Positive for fatigue.  HENT: Positive for postnasal drip.   Endocrine: Positive for cold intolerance.  Musculoskeletal: Positive for arthralgias.  Psychiatric/Behavioral: The patient is nervous/anxious.   All other systems reviewed and are negative.     Objective    BP 120/78   Pulse 87   Temp (!) 96.6 F (35.9 C) (Oral)   Resp 16   Ht 5\' 5"  (1.651 m)  Wt 222 lb (100.7 kg)   SpO2 99%   BMI 36.94 kg/m  Physical Exam Vitals reviewed.  Constitutional:      General: She is not in acute distress.    Appearance: She is well-developed. She is not diaphoretic.     Interventions: She is not intubated. HENT:     Head: Normocephalic and atraumatic.     Right Ear: External ear normal.     Left Ear: External ear normal.     Nose: Nose normal.     Mouth/Throat:     Pharynx: No oropharyngeal exudate.  Eyes:     General: Lids are normal. No scleral icterus.       Right eye: No discharge.        Left eye: No discharge.     Conjunctiva/sclera: Conjunctivae normal.     Right eye: Right conjunctiva is not injected. No exudate or hemorrhage.    Left eye: Left conjunctiva is not injected. No exudate or hemorrhage.    Pupils: Pupils are equal, round, and reactive to light.  Neck:     Thyroid: No thyroid mass or thyromegaly.     Vascular: Normal carotid pulses. No carotid bruit, hepatojugular reflux or JVD.     Trachea: Trachea and phonation normal. No tracheal tenderness or tracheal deviation.     Meningeal: Brudzinski's sign and Kernig's sign absent.  Cardiovascular:     Rate and Rhythm: Normal rate and regular rhythm.     Pulses: Normal pulses.          Radial pulses are 2+ on the right side and 2+ on the left side.       Dorsalis pedis pulses are 2+ on the right side and 2+ on the left side.       Posterior tibial pulses are 2+ on the right side and 2+ on the left side.     Heart sounds: Normal heart sounds, S1 normal  and S2 normal. Heart sounds not distant. No murmur. No friction rub. No gallop.   Pulmonary:     Effort: Pulmonary effort is normal. No tachypnea, bradypnea, accessory muscle usage or respiratory distress. She is not intubated.     Breath sounds: Normal breath sounds. No stridor. No wheezing or rales.  Chest:     Chest wall: No tenderness.  Abdominal:     General: Bowel sounds are normal. There is no distension or abdominal bruit.     Palpations: Abdomen is soft. There is no shifting dullness, fluid wave, hepatomegaly, splenomegaly, mass or pulsatile mass.     Tenderness: There is no abdominal tenderness. There is no guarding or rebound.     Hernia: No hernia is present.  Musculoskeletal:        General: No tenderness. Normal range of motion.     Right forearm: No deformity.       Arms:     Cervical back: Full passive range of motion without pain, normal range of motion and neck supple. No edema, erythema or rigidity. No spinous process tenderness or muscular tenderness. Normal range of motion.     Comments: Right arm scaring from skin graft history, accident. Injury. Normal strength and sensation bilateral upper and lower extremities.    Lymphadenopathy:     Head:     Right side of head: No submental, submandibular, tonsillar, preauricular, posterior auricular or occipital adenopathy.     Left side of head: No submental, submandibular, tonsillar, preauricular, posterior auricular or occipital adenopathy.  Cervical: No cervical adenopathy.     Right cervical: No superficial, deep or posterior cervical adenopathy.    Left cervical: No superficial, deep or posterior cervical adenopathy.     Upper Body:     Right upper body: No supraclavicular or pectoral adenopathy.     Left upper body: No supraclavicular or pectoral adenopathy.  Skin:    General: Skin is warm and dry.     Coloration: Skin is not pale.     Findings: No abrasion, bruising, burn, ecchymosis, erythema, lesion, petechiae  or rash.     Nails: There is no clubbing.  Neurological:     Mental Status: She is alert and oriented to person, place, and time.     GCS: GCS eye subscore is 4. GCS verbal subscore is 5. GCS motor subscore is 6.     Cranial Nerves: No cranial nerve deficit.     Sensory: No sensory deficit.     Motor: No tremor, atrophy, abnormal muscle tone or seizure activity.     Coordination: Coordination normal.     Gait: Gait normal.     Deep Tendon Reflexes: Reflexes are normal and symmetric. Reflexes normal. Babinski sign absent on the right side. Babinski sign absent on the left side.     Reflex Scores:      Tricep reflexes are 2+ on the right side and 2+ on the left side.      Bicep reflexes are 2+ on the right side and 2+ on the left side.      Brachioradialis reflexes are 2+ on the right side and 2+ on the left side.      Patellar reflexes are 2+ on the right side and 2+ on the left side.      Achilles reflexes are 2+ on the right side and 2+ on the left side. Psychiatric:        Speech: Speech normal.        Behavior: Behavior normal.        Thought Content: Thought content normal.        Judgment: Judgment normal.     Patient appers well, not sickly. Speaking in complete sentences. Patient moves on and off of exam table and in room without difficulty. Gait is normal in hall and in room. Patient is oriented to person place time and situation. Patient answers questions appropriately and engages eye contact and verbal dialect with provider.    Results for orders placed or performed in visit on 04/17/20 (from the past 24 hour(s))  POCT urinalysis dipstick     Status: Abnormal   Collection Time: 04/17/20 11:40 AM  Result Value Ref Range   Color, UA dark yellow    Clarity, UA cloudy    Glucose, UA Negative Negative   Bilirubin, UA moderate    Ketones, UA small    Spec Grav, UA 1.025 1.010 - 1.025   Blood, UA large    pH, UA 6.0 5.0 - 8.0   Protein, UA Positive (A) Negative    Urobilinogen, UA 0.2 0.2 or 1.0 E.U./dL   Nitrite, UA negative    Leukocytes, UA Large (3+) (A) Negative   Appearance     Odor      Depression Screen PHQ 2/9 Scores 04/17/2020 10/03/2019 03/14/2019 02/09/2018  PHQ - 2 Score 4 0 2 2  PHQ- 9 Score 15 0 5 5   No results found for any visits on 04/17/20.  Assessment & Plan      Routine  adult health maintenance  Essential hypertension - Plan: CBC with Differential/Platelet, TSH, cloNIDine (CATAPRES) 0.1 MG tablet, amLODipine (NORVASC) 10 MG tablet  Vitamin D deficiency - Plan: Vitamin D (25 hydroxy)  Dyslipidemia - Plan: Lipid panel, CBC with Differential/Platelet, Comprehensive metabolic panel  Encounter for routine adult health examination without abnormal findings  Encounter for screening mammogram for malignant neoplasm of breast - Plan: MM Digital Screening  Depression, recurrent ([place]) - Plan: buPROPion (WELLBUTRIN XL) 150 MG 24 hr tablet  Irregular menstrual cycle - Plan: FSH/LH, Estradiol  History of skin graft- right forearm after injury at work  Orders Placed This Encounter  Procedures  . MM Digital Screening    Standing Status:   Future    Standing Expiration Date:   04/17/2021    Order Specific Question:   Reason for Exam (SYMPTOM  OR DIAGNOSIS REQUIRED)    Answer:   screening breast cancer    Order Specific Question:   Is the patient pregnant?    Answer:   No    Order Specific Question:   Preferred imaging location?    Answer:   [facility]  . Lipid panel  . CBC with Differential/Platelet  . Comprehensive metabolic panel  . TSH  . Vitamin D (25 hydroxy)  . FSH/LH  . Estradiol   She desires to restart her [facility],  She denies any suicidal or homicidal ideations no or in past. PHQ 15 score today.  She has taken Wellbutrin XL at 300 mg tablet once in the morning but has a history of GI upset with it. She reports she did well with the 150 mg XL tablet taking two tablets in the morning.  She discontinued  the Wellbutrin on her own 7 months ago and has been doing fine but feels depression coming back with no known trigger. Counseling recommended.  Alcohol cessations and or decrease in amount recommended. Resources discussed. Health effects discussed.   Discussed known black box warning for anti depression/ anxiety medication. Need to report any behavioral changes right, if any homicidal or suicidal thoughts or ideas seek medical attention right away. Call 911.  Patient verbalized understanding of all instructions given and denies any further questions at this time.     Meds ordered this encounter  Medications  . buPROPion (WELLBUTRIN XL) 150 MG 24 hr tablet    Sig: Take 1 tablet (150 mg total) by mouth daily.    Dispense:  120 tablet    Refill:  0  . cloNIDine (CATAPRES) 0.1 MG tablet    Sig: Take 1 tablet (0.1 mg total) by mouth daily.    Dispense:  90 tablet    Refill:  1  . amLODipine (NORVASC) 10 MG tablet    Sig: Take 1 tablet (10 mg total) by mouth daily.    Dispense:  90 tablet    Refill:  1   Advised patient call the office or your primary care doctor for an appointment if no improvement within 72 hours or if any symptoms change or worsen at any time  Advised ER or urgent Care if after hours or on weekend. Call 911 for emergency symptoms at any time.Patinet verbalized understanding of all instructions given/reviewed and treatment plan and has no further questions or concerns at this time.    Return in about 1 month (around 05/18/2020), or if symptoms worsen or fail to improve, for at any time for any worsening symptoms, Go to Emergency room/ urgent care if worse. For follow up on depression  with repeat PHQ9. Alcohol follow up as well.   IWellington Hampshire Breyonna Nault, FNP, have reviewed all documentation for this visit. The documentation on 04/17/20 for the exam, diagnosis, procedures, and orders are all accurate and complete.    Marcille Buffy, [place] [phone] (phone) [phone] (fax)  [place]

## 2020-04-17 NOTE — Patient Instructions (Signed)
Call to schedule your screening mammogram. Your orders have been placed for your exam.  Let our office know if you have questions, concerns, or any difficulty scheduling.  If normal results then yearly screening mammograms are recommended unless you notice  Changes in your breast then you should schedule a follow up office visit. If abnormal results  Further imaging will be warranted and sooner follow up as determined by the radiologist at the [facility]   [facility] at [place], [place] [address]  Main: [phone]      Bupropion tablets (Depression/Mood Disorders) What is this medicine? BUPROPION (byoo PROE pee on) is used to treat depression. This medicine may be used for other purposes; ask your health care provider or pharmacist if you have questions. COMMON BRAND NAME(S): Wellbutrin What should I tell my health care provider before I take this medicine? They need to know if you have any of these conditions:  an eating disorder, such as anorexia or bulimia  bipolar disorder or psychosis  diabetes or high blood sugar, treated with medication  glaucoma  heart disease, previous heart attack, or irregular heart beat  head injury or brain tumor  high blood pressure  kidney or liver disease  seizures  suicidal thoughts or a previous suicide attempt  Tourette's syndrome  weight loss  an unusual or allergic reaction to bupropion, other medicines, foods, dyes, or preservatives  breast-feeding  pregnant or trying to become pregnant How should I use this medicine? Take this medicine by mouth with a glass of water. Follow the directions on the prescription label. You can take it with or without food. If it upsets your stomach, take it with food. Take your medicine at regular intervals. Do not take your medicine more often than directed. Do not stop taking this medicine suddenly except upon the advice of your doctor.  Stopping this medicine too quickly may cause serious side effects or your condition may worsen. A special MedGuide will be given to you by the pharmacist with each prescription and refill. Be sure to read this information carefully each time. Talk to your pediatrician regarding the use of this medicine in children. Special care may be needed. Overdosage: If you think you have taken too much of this medicine contact a poison control center or emergency room at once. NOTE: This medicine is only for you. Do not share this medicine with others. What if I miss a dose? If you miss a dose, take it as soon as you can. If it is less than four hours to your next dose, take only that dose and skip the missed dose. Do not take double or extra doses. What may interact with this medicine? Do not take this medicine with any of the following medications:  linezolid  MAOIs like Azilect, Carbex, Eldepryl, Marplan, Nardil, and Parnate  methylene blue (injected into a vein)  other medicines that contain bupropion like Zyban This medicine may also interact with the following medications:  alcohol  certain medicines for anxiety or sleep  certain medicines for blood pressure like metoprolol, propranolol  certain medicines for depression or psychotic disturbances  certain medicines for HIV or AIDS like efavirenz, lopinavir, nelfinavir, ritonavir  certain medicines for irregular heart beat like propafenone, flecainide  certain medicines for Parkinson's disease like amantadine, levodopa  certain medicines for seizures like carbamazepine, phenytoin, phenobarbital  cimetidine  clopidogrel  cyclophosphamide  digoxin  furazolidone  isoniazid  nicotine  orphenadrine  procarbazine  steroid medicines like prednisone or cortisone  stimulant medicines for attention disorders, weight loss, or to stay awake  tamoxifen  theophylline  thiotepa  ticlopidine  tramadol  warfarin This list  may not describe all possible interactions. Give your health care provider a list of all the medicines, herbs, non-prescription drugs, or dietary supplements you use. Also tell them if you smoke, drink alcohol, or use illegal drugs. Some items may interact with your medicine. What should I watch for while using this medicine? Tell your doctor if your symptoms do not get better or if they get worse. Visit your doctor or healthcare provider for regular checks on your progress. Because it may take several weeks to see the full effects of this medicine, it is important to continue your treatment as prescribed by your doctor. This medicine may cause serious skin reactions. They can happen weeks to months after starting the medicine. Contact your healthcare provider right away if you notice fevers or flu-like symptoms with a rash. The rash may be red or purple and then turn into blisters or peeling of the skin. Or, you might notice a red rash with swelling of the face, lips or lymph nodes in your neck or under your arms. Patients and their families should watch out for new or worsening thoughts of suicide or depression. Also watch out for sudden changes in feelings such as feeling anxious, agitated, panicky, irritable, hostile, aggressive, impulsive, severely restless, overly excited and hyperactive, or not being able to sleep. If this happens, especially at the beginning of treatment or after a change in dose, call your healthcare provider. Avoid alcoholic drinks while taking this medicine. Drinking excessive alcoholic beverages, using sleeping or anxiety medicines, or quickly stopping the use of these agents while taking this medicine may increase your risk for a seizure. Do not drive or use heavy machinery until you know how this medicine affects you. This medicine can impair your ability to perform these tasks. Do not take this medicine close to bedtime. It may prevent you from sleeping. Your mouth may get  dry. Chewing sugarless gum or sucking hard candy, and drinking plenty of water may help. Contact your doctor if the problem does not go away or is severe. What side effects may I notice from receiving this medicine? Side effects that you should report to your doctor or health care professional as soon as possible:  allergic reactions like skin rash, itching or hives, swelling of the face, lips, or tongue  breathing problems  changes in vision  confusion  elevated mood, decreased need for sleep, racing thoughts, impulsive behavior  fast or irregular heartbeat  hallucinations, loss of contact with reality  increased blood pressure  rash, fever, and swollen lymph nodes  redness, blistering, peeling, or loosening of the skin, including inside the mouth  seizures  suicidal thoughts or other mood changes  unusually weak or tired  vomiting Side effects that usually do not require medical attention (report to your doctor or health care professional if they continue or are bothersome):  constipation  headache  loss of appetite  nausea  tremors  weight loss This list may not describe all possible side effects. Call your doctor for medical advice about side effects. You may report side effects to FDA at 1-800-FDA-1088. Where should I keep my medicine? Keep out of the reach of children. Store at room temperature between 20 and 25 degrees C (68 and 77 degrees F), away from direct sunlight and moisture. Keep tightly  closed. Throw away any unused medicine after the expiration date. NOTE: This sheet is a summary. It may not cover all possible information. If you have questions about this medicine, talk to your doctor, pharmacist, or health care provider.  2020 Elsevier/Gold Standard (2019-02-02 14:02:47)   Persistent Depressive Disorder  Persistent depressive disorder (PDD) is a mental health condition. PDD causes symptoms of low-level depression for 2 years or longer. It may  also be called long-term (chronic) depression or dysthymia. PDD may include episodes of more severe depression that last for about 2 weeks (major depressive disorder or MDD). PDD can affect the way you think, feel, and sleep. This condition may also affect your relationships. You may be more likely to get sick if you have PDD. Symptoms of PDD occur for most of the day and may include:  Feeling tired (fatigue).  Low energy.  Eating too much or too little.  Sleeping too much or too little.  Feeling restless or agitated.  Feeling hopeless.  Feeling worthless or guilty.  Feeling worried or nervous (anxiety).  Trouble concentrating or making decisions.  Low self-esteem.  A negative way of looking at things (outlook).  Not being able to have fun or feel pleasure.  Avoiding interacting with people.  Getting angry or annoyed easily (irritability).  Acting aggressive or angry. Follow these instructions at home: Activity  Go back to your normal activities as told by your doctor.  Exercise regularly as told by your doctor. General instructions  Take over-the-counter and prescription medicines only as told by your doctor.  Do not drink alcohol. Or, limit how much alcohol you drink to no more than 1 drink a day for nonpregnant women and 2 drinks a day for men. One drink equals 12 oz of beer, 5 oz of wine, or 1 oz of hard liquor. Alcohol can affect any antidepressant medicines you are taking. Talk with your doctor about your alcohol use.  Eat a healthy diet and get plenty of sleep.  Find activities that you enjoy each day.  Consider joining a support group. Your doctor may be able to suggest a support group.  Keep all follow-up visits as told by your doctor. This is important. Where to find more information [employer] on Mental Illness  www.nami.org [place] National Institute of Mental Health  https://carter.com/ National Suicide Prevention  Lifeline  9141108193).  This is free, 24-hour help. Contact a doctor if:  Your symptoms get worse.  You have new symptoms.  You have trouble sleeping or doing your daily activities. Get help right away if:  You self-harm.  You have serious thoughts about hurting yourself or others.  You see, hear, taste, smell, or feel things that are not there (hallucinate). This information is not intended to replace advice given to you by your health care provider. Make sure you discuss any questions you have with your health care provider. Document Revised: 10/22/2017 Document Reviewed: 07/03/2016 Elsevier Patient Education  2020 [employer].

## 2020-04-17 NOTE — Progress Notes (Signed)
Sent for urine culture - patient is asymptomatic.

## 2020-04-18 LAB — FSH/LH
FSH: 5.8 m[IU]/mL
LH: 12.2 m[IU]/mL

## 2020-04-18 LAB — COMPREHENSIVE METABOLIC PANEL
ALT: 18 IU/L (ref 0–32)
AST: 17 IU/L (ref 0–40)
Albumin/Globulin Ratio: 1.2 (ref 1.2–2.2)
Albumin: 3.7 g/dL — ABNORMAL LOW (ref 3.8–4.8)
Alkaline Phosphatase: 74 IU/L (ref 48–121)
BUN/Creatinine Ratio: 15 (ref 9–23)
BUN: 10 mg/dL (ref 6–24)
Bilirubin Total: 0.4 mg/dL (ref 0.0–1.2)
CO2: 22 mmol/L (ref 20–29)
Calcium: 9.6 mg/dL (ref 8.7–10.2)
Chloride: 100 mmol/L (ref 96–106)
Creatinine, Ser: 0.66 mg/dL (ref 0.57–1.00)
GFR calc Af Amer: 122 mL/min/{1.73_m2} (ref >59)
GFR calc non Af Amer: 106 mL/min/{1.73_m2} (ref >59)
Globulin, Total: 3.1 g/dL (ref 1.5–4.5)
Glucose: 109 mg/dL — ABNORMAL HIGH (ref 65–99)
Potassium: 3.8 mmol/L (ref 3.5–5.2)
Sodium: 137 mmol/L (ref 134–144)
Total Protein: 6.8 g/dL (ref 6.0–8.5)

## 2020-04-18 LAB — CBC WITH DIFFERENTIAL/PLATELET
Basophils Absolute: 0 10*3/uL (ref 0.0–0.2)
Basos: 0 %
EOS (ABSOLUTE): 0.1 10*3/uL (ref 0.0–0.4)
Eos: 2 %
Hematocrit: 39.3 % (ref 34.0–46.6)
Hemoglobin: 13.1 g/dL (ref 11.1–15.9)
Immature Grans (Abs): 0 10*3/uL (ref 0.0–0.1)
Immature Granulocytes: 0 %
Lymphocytes Absolute: 2.1 10*3/uL (ref 0.7–3.1)
Lymphs: 31 %
MCH: 30.9 pg (ref 26.6–33.0)
MCHC: 33.3 g/dL (ref 31.5–35.7)
MCV: 93 fL (ref 79–97)
Monocytes Absolute: 0.5 10*3/uL (ref 0.1–0.9)
Monocytes: 7 %
Neutrophils Absolute: 4 10*3/uL (ref 1.4–7.0)
Neutrophils: 60 %
Platelets: 293 10*3/uL (ref 150–450)
RBC: 4.24 x10E6/uL (ref 3.77–5.28)
RDW: 13.1 % (ref 11.7–15.4)
WBC: 6.8 10*3/uL (ref 3.4–10.8)

## 2020-04-18 LAB — LIPID PANEL
Chol/HDL Ratio: 3.6 ratio (ref 0.0–4.4)
Cholesterol, Total: 192 mg/dL (ref 100–199)
HDL: 54 mg/dL (ref >39)
LDL Chol Calc (NIH): 121 mg/dL — ABNORMAL HIGH (ref 0–99)
Triglycerides: 95 mg/dL (ref 0–149)
VLDL Cholesterol Cal: 17 mg/dL (ref 5–40)

## 2020-04-18 LAB — VITAMIN D 25 HYDROXY (VIT D DEFICIENCY, FRACTURES): Vit D, 25-Hydroxy: 36.9 ng/mL (ref 30.0–100.0)

## 2020-04-18 LAB — TSH: TSH: 1.35 u[IU]/mL (ref 0.450–4.500)

## 2020-04-18 LAB — ESTRADIOL: Estradiol: 197 pg/mL

## 2020-04-19 ENCOUNTER — Telehealth: Payer: Self-pay

## 2020-04-19 DIAGNOSIS — R82998 Other abnormal findings in urine: Secondary | ICD-10-CM

## 2020-04-19 MED ORDER — CEPHALEXIN 500 MG PO CAPS: 500.0000 mg | ORAL_CAPSULE | Freq: Two times a day (BID) | ORAL | 0 refills | Status: DC

## 2020-04-19 NOTE — Telephone Encounter (Signed)
Patient advised on lab results and medication will be sent to pharmacy.

## 2020-04-19 NOTE — Telephone Encounter (Signed)
-----   Message from Doreen Beam, [place] sent at 04/19/2020 11:18 AM EDT ----- Please call patient: Urine culture with E-Coli.  Would like to send in Keflex 500 mg BID for 7 days # 14 tablets as long as no allergy which I do not see on list. Verify allergies.  Return to office for visit if any symptoms develop/ change or worsen at anytime.

## 2020-04-19 NOTE — Progress Notes (Signed)
Please call patient: Urine culture with E-Coli.  Would like to send in Keflex 500 mg BID for 7 days # 14 tablets as long as no allergy which I do not see on list. Verify allergies.  Return to office for visit if any symptoms develop/ change or worsen at anytime.

## 2020-04-20 LAB — URINE CULTURE

## 2020-04-23 ENCOUNTER — Other Ambulatory Visit: Payer: Self-pay | Admitting: Adult Health

## 2020-04-23 MED ORDER — AMOXICILLIN-POT CLAVULANATE 875-125 MG PO TABS: 1.0000 | ORAL_TABLET | Freq: Two times a day (BID) | ORAL | 0 refills | Status: DC

## 2020-04-23 NOTE — Progress Notes (Signed)
CBC within normal limits, no anemia or signs of infection.  Glucose is mildly elevated at 109, should monitor diet and exercise should be increased. Kidney and liver function are within normal limits and CMP. Cholesterol is within normal limits, LDL which is bad cholesterol is 121 this is elevated would like this to be under 99.  Suggest dietary changes and measures and increased exercise.   Lipid panel , CBC and CMP , vitamin  d recheck in 6 months- please add labs.  Vitamin D is within normal range, can you verify with her what she has been taking as far as vitamin D dosage and if it is over-the-counter or prescription and how long she has been doing that dose.

## 2020-04-23 NOTE — Progress Notes (Signed)
Final culture shows Klebsiella pneumonia in urine, it was E- coli and she was given Keflex. Will give Augmentin to take BID x 3 days to cover per sensitivity. Sent to pharmacy. Follow up if any symptoms after treatment. Marland Kitchen

## 2020-04-23 NOTE — Progress Notes (Signed)
Meds ordered this encounter  Medications  . amoxicillin-clavulanate (AUGMENTIN) 875-125 MG tablet    Sig: Take 1 tablet by mouth 2 (two) times daily.    Dispense:  6 tablet    Refill:  0  for UTI per C & [facility].

## 2020-05-31 ENCOUNTER — Encounter: Payer: Self-pay | Admitting: Adult Health

## 2020-06-11 ENCOUNTER — Other Ambulatory Visit: Payer: Self-pay | Admitting: Adult Health

## 2020-06-11 DIAGNOSIS — Z1231 Encounter for screening mammogram for malignant neoplasm of breast: Secondary | ICD-10-CM

## 2020-06-20 ENCOUNTER — Encounter: Payer: Self-pay | Admitting: Adult Health

## 2020-07-02 ENCOUNTER — Other Ambulatory Visit: Payer: Self-pay

## 2020-07-02 ENCOUNTER — Ambulatory Visit
Admission: RE | Admit: 2020-07-02 | Discharge: 2020-07-02 | Disposition: A | Discharge status: Home / Self Care | Payer: BC Managed Care – PPO | Source: Ambulatory Visit | Attending: Adult Health | Admitting: Adult Health

## 2020-07-02 DIAGNOSIS — Z1231 Encounter for screening mammogram for malignant neoplasm of breast: Secondary | ICD-10-CM

## 2020-07-11 ENCOUNTER — Other Ambulatory Visit: Payer: Self-pay | Admitting: Adult Health

## 2020-07-11 DIAGNOSIS — F339 Major depressive disorder, recurrent, unspecified: Secondary | ICD-10-CM

## 2020-07-11 NOTE — Telephone Encounter (Signed)
Medication Refill - Medication: buPROPion (WELLBUTRIN XL) 150 MG 24 hr tablet   Patient stated she has been out of meds for 4 days   Preferred Pharmacy (with phone number or street name):  [place], [place] Phone:  [phone]  Fax:  [phone]       Agent: Please be advised that RX refills may take up to 3 business days. We ask that you follow-up with your pharmacy.

## 2020-07-22 ENCOUNTER — Other Ambulatory Visit: Payer: Self-pay | Admitting: Adult Health

## 2020-07-22 DIAGNOSIS — F339 Major depressive disorder, recurrent, unspecified: Secondary | ICD-10-CM

## 2020-07-22 MED ORDER — BUPROPION HCL ER (XL) 150 MG PO TB24: 150.0000 mg | ORAL_TABLET | Freq: Every day | ORAL | 1 refills | Status: DC

## 2020-07-22 NOTE — Telephone Encounter (Signed)
Medication Refill - Medication: buproprion first call 07/11/20 patient completely out and is currently missing work.  Has the patient contacted their pharmacy? Yes.   (Agent: If no, request that the patient contact the pharmacy for the refill.) (Agent: If yes, when and what did the pharmacy advise?)  Preferred Pharmacy (with phone number or street name): [place], [place]: Please be advised that RX refills may take up to 3 business days. We ask that you follow-up with your pharmacy.

## 2020-07-24 ENCOUNTER — Ambulatory Visit: Payer: Self-pay | Admitting: Adult Health

## 2020-08-07 ENCOUNTER — Ambulatory Visit: Payer: Self-pay | Admitting: Adult Health

## 2020-08-28 ENCOUNTER — Other Ambulatory Visit: Payer: Self-pay

## 2020-08-28 ENCOUNTER — Encounter: Payer: Self-pay | Admitting: Adult Health

## 2020-08-28 ENCOUNTER — Ambulatory Visit: Payer: BC Managed Care – PPO | Admitting: Adult Health

## 2020-08-28 VITALS — BP 150/76 | HR 90 | Temp 98.6°F | Resp 16 | Wt 219.6 lb

## 2020-08-28 DIAGNOSIS — I10 Essential (primary) hypertension: Secondary | ICD-10-CM

## 2020-08-28 DIAGNOSIS — R8290 Unspecified abnormal findings in urine: Secondary | ICD-10-CM | POA: Diagnosis not present

## 2020-08-28 DIAGNOSIS — F339 Major depressive disorder, recurrent, unspecified: Secondary | ICD-10-CM | POA: Diagnosis not present

## 2020-08-28 LAB — POCT URINALYSIS DIPSTICK
Glucose, UA: NEGATIVE
Nitrite, UA: POSITIVE
Protein, UA: POSITIVE — AB
Spec Grav, UA: 1.025 (ref 1.010–1.025)
Urobilinogen, UA: 0.2 E.U./dL
pH, UA: 5 (ref 5.0–8.0)

## 2020-08-28 MED ORDER — BUPROPION HCL ER (XL) 150 MG PO TB24: 150.0000 mg | ORAL_TABLET | Freq: Two times a day (BID) | ORAL | 0 refills | Status: DC

## 2020-08-28 NOTE — Patient Instructions (Addendum)
Alcohol Use Education Information about Your Drinking Your score on the Alcohol Use Disorders Identification Test was: AUDIT C:    TOTAL AUDIT SCORE:   .  This score places you in the category of:  Score 0 = Abstainers Score 8-19 = Unhealthy/High Risk Drinkers  Score 1-7 = Low Risk Drinkers Score 20+ = Probable Alcohol Dependence   High Scores (20+) on the Alcohol Use Identification Test Consider becoming involved in a structured program.  You should stop drinking if: You have tried to cut down before but have not been successful, or  You suffer from morning shakes during a heavy drinking period, or You have high blood pressure, or You are pregnant, or You have liver disease, or You are taking medicines that react with alcohol, or Your alcohol use is affecting your social relationships, or You have legal consequences like DUIs, or You call in sick to work, or You cannot take care of our children, or Someone close to you says you drink too much    How Much Alcohol is a Drink: Beer: 12 oz. = 1 drink 16 oz. = 1.3 drinks 22 oz. = 2 drinks 40 oz. = 3.3 drinks  Wine: 5 oz. = 1 drink 740 mL (25 oz.) bottle = 5 drinks Malt Liquor: 12 oz. = 1.5 drinks 16 oz. = 2 drinks 22 oz. = 2.5 drinks 40 oz. = 4.5 drinks  80-Proof Spirits - Hard Liquor: 1 shot = 1 drink 1 mixed drink = number of shots Can equal 1-3 drinks   What is Low-risk Drinking? Have no more than 2 drinks of alcohol per day Drink no more than 5 days per week Do not drink alcohol drink alcohol when: You drive or operate machinery You are pregnant or breast feeding You are taking medications that interact with alcohol You have medical conditions made worse with alcohol You can stop or control your drinking      Identify Your Triggers for Drinking Parties Particular People Feeling lonely Feeling tense Family problems Feeling sad Feeling happy Feeling bored After work Problems  sleeping Criticism Feelings of failure After being paid When others are drinking In bars When out for dinner After arguments Weekends Feeling restless Being in pain   Effects of High-Risk Drinking To the Brain: Aggressive, irrational behavior Arguments, violence Depression, nervousness Alcohol dependence, memory loss To the Nervous System: Trembling hands, tingling fingers Numbness, painful nerves Impaired sensation leading to falls Numb tingling toes To Your Lifestyle: Social, legal, medical problems Domestic trouble/relationship loss Job loss & financial problems Shortened life span Accidents and death from drunk driving   To the Face: Premature aging, drinker's nose Cancer of the throat & mouth To the Body: Frequent cold Reduced resistance to infection Increased risk of pneumonia Weakness of heart muscle Heart failure, anemia Impaired blood clotting Breast cancer Vitamin deficiency, bleeding Severe Inflammation of the stomach Vomiting, diarrhea, malnutrition Ulcer, inflammation of the pancreas Impaired sexual performance Birth defects, including deformities, retardation, and low birthweight   Ways to Cope Without Drinking Go home if you tend to drink after work Find another activity Switch to nonalcoholic beverages Change friends Join a club Volunteer Visit relatives Plan/take a trip Go for a walk Take up a hobby Listen to music Talk to a friend Reading What would you do if you had no worries about failing?         Good Reasons for Drinking Less I will live longer - probably 8-10 years. I will sleep better. I   activity . Switch to nonalcoholic beverages . Change friends . Join a club . Volunteer . Visit relatives . Plan/take a trip . Go for a walk . Take up a hobby . Listen to music . Talk to a friend . Reading . What would you do if you had no worries about failing?         Good Reasons for Drinking Less . I will live longer - probably 8-10 years. . I will sleep better. . I will be happier. . I will save a lot of money . My relationships will improve. . I will stay younger for longer. . I will achieve more in my life . There will be a greater chance that I will  survive to a healthy old age with no premature damage to my brain.  . I will be better at my job. . I will be less likely to feel depressed and commit suicide (6 times less likely). . I will be less likely to die of heart disease or cancer. . Other people will respect me . I will be less likely to get into trouble with the police. . The possibility that I will die of liver disease will be dramatically reduced (12 times less likely). . It will be less likely that I will die in a car accident (3 times less likely).   Strategies for Cutting Down Keep Track.  Find a way to keep track of how much you drink.  If you make a note of each drink before you drink it, this will help slow you down. Count and Measure.  Know the standard drink sizes.  Ask the bartender or server about the amount of alcohol in a mixed drink. Set Goals.  Decide how many days a week you will drink and how many drinks each day. Pace and Space.  When you do drink, pace yourself.  Have no more than one drink with alcohol per hour.  Alternate "drink spacers" non-alcoholic drinks such as water, soda, or juice with drinks containing alcohol. Include Food.  Don't drink on an empty stomach.  Have some food so the alcohol will be absorbed more slowly into your system.  Avoid Triggers.  Avoid people, places, or activities that have led to drinking in the past.  Certain times of day or feelings may also be triggers.  Make a plan so you will know what you can do instead of drinking. Plan to Handle Urges.  When an urge hits, consider these options:  Remind yourself of your reasons for changing.  Or talk it through with someone you trust. Or get involved with a healthy, distracting activity.  Or, "urge surf" - instead of fighting the feeling, accept it and ride it out, knowing it will soon crest like a wave and pass. Know Your "No".  Have a polite, convincing "no thanks" for those times when you may be offered a drink and don't want one.  The  faster you can say no to these offers, the less likely you are to give in.  If you hesitate, it allows you time to think of excuses to go along.        Let me know if you change your mind and would like recommended psychiatry appointment.  Psychiatric/Counseling Resources Discussed As Follows:  If Emergency please seek Emergency Room Care Immediately or Call 911.   [facility] Address:  [place], [place] [address] Phone: [phone] Website : [email or web address]   [facility]   Address:  [address] [place], [place] [address] Phone: [phone] Fax: [phone] Website: [email or web address] How To Access Our Services Because our main goal is to meet the needs of our consumers, [facility] operates on a walk-in basis! To access services, there are just 3 easy steps: 1) Walk in any Monday, Wednesday or Friday between 8:00 am and 3:00 pm and complete our consumer paperwork 2) A Comprehensive Clinical Assessment (CCA) will be completed and appropriate service recommendations will be provided 3) Recommendations are sent to [facility] team members and the appropriate staff will call you within days. Advanced Access Open M - F, 8:00 am - 8:00 pm  Mental health crisis services for all age groups  Triage  Psychiatric Evaluations  Involuntary Commitments  [facility]  Address: [address], [place], [place] [address] Website : [email or web address] Walk in's accepted see web site or call for more information Phone : [phone] Also has [facility] [phone]    Psychology Today Find a therapist by searching online in your area or specialist by your diagnosis Website:  [email or web address]       Bupropion extended-release tablets (Depression/Mood Disorders) What is this medicine? BUPROPION (byoo PROE pee on) is used to treat  depression. This medicine may be used for other purposes; ask your health care provider or pharmacist if you have questions. COMMON BRAND NAME(S): Aplenzin, Budeprion XL, Forfivo XL, Wellbutrin XL What should I tell my health care provider before I take this medicine? They need to know if you have any of these conditions:  an eating disorder, such as anorexia or bulimia  bipolar disorder or psychosis  diabetes or high blood sugar, treated with medication  glaucoma  head injury or brain tumor  heart disease, previous heart attack, or irregular heart beat  high blood pressure  kidney or liver disease  seizures (convulsions)  suicidal thoughts or a previous suicide attempt  Tourette's syndrome  weight loss  an unusual or allergic reaction to bupropion, other medicines, foods, dyes, or preservatives  breast-feeding  pregnant or trying to become pregnant How should I use this medicine? Take this medicine by mouth with a glass of water. Follow the directions on the prescription label. You can take it with or without food. If it upsets your stomach, take it with food. Do not crush, chew, or cut these tablets. This medicine is taken once daily at the same time each day. Do not take your medicine more often than directed. Do not stop taking this medicine suddenly except upon the advice of your doctor. Stopping this medicine too quickly may cause serious side effects or your condition may worsen. A special MedGuide will be given to you by the pharmacist with each prescription and refill. Be sure to read this information carefully each time. Talk to your pediatrician regarding the use of this medicine in children. Special care may be needed. Overdosage: If you think you have taken too much of this medicine contact a poison control center or emergency room at once. NOTE: This medicine is only for you. Do not share this medicine with others. What if I miss a dose? If you miss a dose,  skip the missed dose and take your next tablet at the regular time. Do not take double or extra doses. What may interact with this medicine? Do not take this medicine with any of the following medications:  linezolid  MAOIs like Azilect, Carbex, Eldepryl, Marplan, Nardil, and Parnate  methylene blue (injected  into a vein)  other medicines that contain bupropion like Zyban This medicine may also interact with the following medications:  alcohol  certain medicines for anxiety or sleep  certain medicines for blood pressure like metoprolol, propranolol  certain medicines for depression or psychotic disturbances  certain medicines for HIV or AIDS like efavirenz, lopinavir, nelfinavir, ritonavir  certain medicines for irregular heart beat like propafenone, flecainide  certain medicines for Parkinson's disease like amantadine, levodopa  certain medicines for seizures like carbamazepine, phenytoin, phenobarbital  cimetidine  clopidogrel  cyclophosphamide  digoxin  furazolidone  isoniazid  nicotine  orphenadrine  procarbazine  steroid medicines like prednisone or cortisone  stimulant medicines for attention disorders, weight loss, or to stay awake  tamoxifen  theophylline  thiotepa  ticlopidine  tramadol  warfarin This list may not describe all possible interactions. Give your health care provider a list of all the medicines, herbs, non-prescription drugs, or dietary supplements you use. Also tell them if you smoke, drink alcohol, or use illegal drugs. Some items may interact with your medicine. What should I watch for while using this medicine? Tell your doctor if your symptoms do not get better or if they get worse. Visit your doctor or healthcare provider for regular checks on your progress. Because it may take several weeks to see the full effects of this medicine, it is important to continue your treatment as prescribed by your doctor. This medicine may  cause serious skin reactions. They can happen weeks to months after starting the medicine. Contact your healthcare provider right away if you notice fevers or flu-like symptoms with a rash. The rash may be red or purple and then turn into blisters or peeling of the skin. Or, you might notice a red rash with swelling of the face, lips or lymph nodes in your neck or under your arms. Patients and their families should watch out for new or worsening thoughts of suicide or depression. Also watch out for sudden changes in feelings such as feeling anxious, agitated, panicky, irritable, hostile, aggressive, impulsive, severely restless, overly excited and hyperactive, or not being able to sleep. If this happens, especially at the beginning of treatment or after a change in dose, call your healthcare provider. Avoid alcoholic drinks while taking this medicine. Drinking large amounts of alcoholic beverages, using sleeping or anxiety medicines, or quickly stopping the use of these agents while taking this medicine may increase your risk for a seizure. Do not drive or use heavy machinery until you know how this medicine affects you. This medicine can impair your ability to perform these tasks. Do not take this medicine close to bedtime. It may prevent you from sleeping. Your mouth may get dry. Chewing sugarless gum or sucking hard candy, and drinking plenty of water may help. Contact your doctor if the problem does not go away or is severe. The tablet shell for some brands of this medicine does not dissolve. This is normal. The tablet shell may appear whole in the stool. This is not a cause for concern. What side effects may I notice from receiving this medicine? Side effects that you should report to your doctor or health care professional as soon as possible:  allergic reactions like skin rash, itching or hives, swelling of the face, lips, or tongue  breathing problems  changes in vision  confusion  elevated  mood, decreased need for sleep, racing thoughts, impulsive behavior  fast or irregular heartbeat  hallucinations, loss of contact with reality  increased blood  pressure  rash, fever, and swollen lymph nodes  redness, blistering, peeling or loosening of the skin, including inside the mouth  seizures  suicidal thoughts or other mood changes  unusually weak or tired  vomiting Side effects that usually do not require medical attention (report to your doctor or health care professional if they continue or are bothersome):  constipation  headache  loss of appetite  nausea  tremors  weight loss This list may not describe all possible side effects. Call your doctor for medical advice about side effects. You may report side effects to FDA at 1-800-FDA-1088. Where should I keep my medicine? Keep out of the reach of children. Store at room temperature between 15 and 30 degrees C (59 and 86 degrees F). Throw away any unused medicine after the expiration date. NOTE: This sheet is a summary. It may not cover all possible information. If you have questions about this medicine, talk to your doctor, pharmacist, or health care provider.  2020 Elsevier/Gold Standard (2019-02-02 13:45:31) Alcohol Abuse and Dependence Information, Adult Alcohol is a widely available drug. People drink alcohol in different amounts. People who drink alcohol very often and in large amounts often have problems during and after drinking. They may develop what is called an alcohol use disorder. There are two main types of alcohol use disorders:  Alcohol abuse. This is when you use alcohol too much or too often. You may use alcohol to make yourself feel happy or to reduce stress. You may have a hard time setting a limit on the amount you drink.  Alcohol dependence. This is when you use alcohol consistently for a period of time, and your body changes as a result. This can make it hard to stop drinking because you may  start to feel sick or feel different when you do not use alcohol. These symptoms are known as withdrawal. How can alcohol abuse and dependence affect me? Alcohol abuse and dependence can have a negative effect on your life. Drinking too much can lead to addiction. You may feel like you need alcohol to function normally. You may drink alcohol before work in the morning, during the day, or as soon as you get home from work in the evening. These actions can result in:  Poor work performance.  Job loss.  Financial problems.  Car crashes or criminal charges from driving after drinking alcohol.  Problems in your relationships with friends and family.  Losing the trust and respect of coworkers, friends, and family. Drinking heavily over a long period of time can permanently damage your body and brain, and can cause lifelong health issues, such as:  Damage to your liver or pancreas.  Heart problems, high blood pressure, or stroke.  Certain cancers.  Decreased ability to fight infections.  Brain or nerve damage.  Depression.  Early (premature) death. If you are careless or you crave alcohol, it is easy to drink more than your body can handle (overdose). Alcohol overdose is a serious situation that requires hospitalization. It may lead to permanent injuries or death. What can increase my risk?  Having a family history of alcohol abuse.  Having depression or other mental health conditions.  Beginning to drink at an early age.  Binge drinking often.  Experiencing trauma, stress, and an unstable home life during childhood.  Spending time with people who drink often. What actions can I take to prevent or manage alcohol abuse and dependence?  Do not drink alcohol if: ? Your health care provider  tells you not to drink. ? You are pregnant, may be pregnant, or are planning to become pregnant.  If you drink alcohol: ? Limit how much you use to:  0-1 drink a day for women.  0-2  drinks a day for men. ? Be aware of how much alcohol is in your drink. In the [place] one drink equals one 12 oz bottle of beer (355 mL), one 5 oz glass of wine (148 mL), or one 1 oz glass of hard liquor (44 mL).  Stop drinking if you have been drinking too much. This can be very hard to do if you are used to abusing alcohol. If you begin to have withdrawal symptoms, talk with your health care provider or a person that you trust. These symptoms may include anxiety, shaky hands, headache, nausea, sweating, or not being able to sleep.  Choose to drink nonalcoholic beverages in social gatherings and places where there may be alcohol. Activity  Spend more time on activities that you enjoy that do not involve alcohol, like hobbies or exercise.  Find healthy ways to cope with stress, such as exercise, meditation, or spending time with people you care about. General information  Talk to your family, coworkers, and friends about supporting you in your efforts to stop drinking. If they drink, ask them not to drink around you. Spend more time with people who do not drink alcohol.  If you think that you have an alcohol dependency problem: ? Tell friends or family about your concerns. ? Talk with your health care provider or another health professional about where to get help. ? Work with a Transport planner and a Regulatory affairs officer. ? Consider joining a support group for people who struggle with alcohol abuse and dependence. Where to find support   Your health care provider.  SMART Recovery: www.smartrecovery.org Therapy and support groups  Local treatment centers or chemical dependency counselors.  Local AA groups in your community: NicTax.com.pt Where to find more information  Centers for Disease Control and Prevention: http://www.wolf.info/  National Institute on Alcohol Abuse and Alcoholism: http://www.bradshaw.com/  Alcoholics Anonymous (AA): NicTax.com.pt Contact a health care provider if:  You drank  more or for longer than you intended on more than one occasion.  You tried to stop drinking or to cut back on how much you drink, but you were not able to.  You often drink to the point of vomiting or passing out.  You want to drink so badly that you cannot think about anything else.  You have problems in your life due to drinking, but you continue to drink.  You keep drinking even though you feel anxious, depressed, or have experienced memory loss.  You have stopped doing the things you used to enjoy in order to drink.  You have to drink more than you used to in order to get the effect you want.  You experience anxiety, sweating, nausea, shakiness, and trouble sleeping when you try to stop drinking. Get help right away if:  You have thoughts about hurting yourself or others.  You have serious withdrawal symptoms, including: ? Confusion. ? Racing heart. ? High blood pressure. ? Fever. If you ever feel like you may hurt yourself or others, or have thoughts about taking your own life, get help right away. You can go to your nearest emergency department or call:  Your local emergency services (911 in the [place]  A suicide crisis helpline, such as the [place] at (445)005-3692. This is  open 24 hours a day. Summary  Alcohol abuse and dependence can have a negative effect on your life. Drinking too much or too often can lead to addiction.  If you drink alcohol, limit how much you use.  If you are having trouble keeping your drinking under control, find ways to change your behavior. Hobbies, calming activities, exercise, or support groups can help.  If you feel you need help with changing your drinking habits, talk with your health care provider, a good friend, or a therapist, or go to an [place] group. This information is not intended to replace advice given to you by your health care provider. Make sure you discuss any questions you have with your health  care provider. Document Revised: 02/28/2019 Document Reviewed: 01/17/2019 Elsevier Patient Education  [place].
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
## 2020-08-30 ENCOUNTER — Other Ambulatory Visit: Payer: Self-pay | Admitting: Adult Health

## 2020-08-30 DIAGNOSIS — N390 Urinary tract infection, site not specified: Secondary | ICD-10-CM

## 2020-08-30 LAB — URINE CULTURE

## 2020-08-30 MED ORDER — CEPHALEXIN 500 MG PO CAPS: 500.0000 mg | ORAL_CAPSULE | Freq: Four times a day (QID) | ORAL | 0 refills | Status: DC

## 2020-08-30 NOTE — Progress Notes (Signed)
E coli forming in urine culture - sent below medication to start. Verified allergy list.   cephALEXin (KEFLEX) 500 MG capsule   Sig: Take 1 capsule (500 mg total) by mouth 4 (four) times daily.   Dispense:  40 capsule   Refill:  0 Will call if need to change antibiotic per sensitivity report when lab completed.  Return to office by appointment if not improving at anytime.

## 2020-08-30 NOTE — Progress Notes (Signed)
Take antibiotic as prescribed is suscptble to antibiotiuc.  Follow up if any symptoms persist at anytime

## 2020-08-30 NOTE — Progress Notes (Signed)
Meds ordered this encounter  Medications  . cephALEXin (KEFLEX) 500 MG capsule    Sig: Take 1 capsule (500 mg total) by mouth 4 (four) times daily.    Dispense:  40 capsule    Refill:  0  reoccurring UTI- ecoli greater than 100,000 colonies forming, Sent above script 08/30/2020.

## 2020-09-03 ENCOUNTER — Encounter: Payer: Self-pay | Admitting: Adult Health

## 2020-09-21 IMAGING — MG DIGITAL SCREENING BILAT W/ TOMO W/ CAD
8 series · 8 of 24 positions shown · non-contrast
Comparison: Previous exam(s).

CLINICAL DATA: Screening.

EXAM:
DIGITAL SCREENING BILATERAL MAMMOGRAM WITH TOMO AND CAD

[L CC synth-2D]
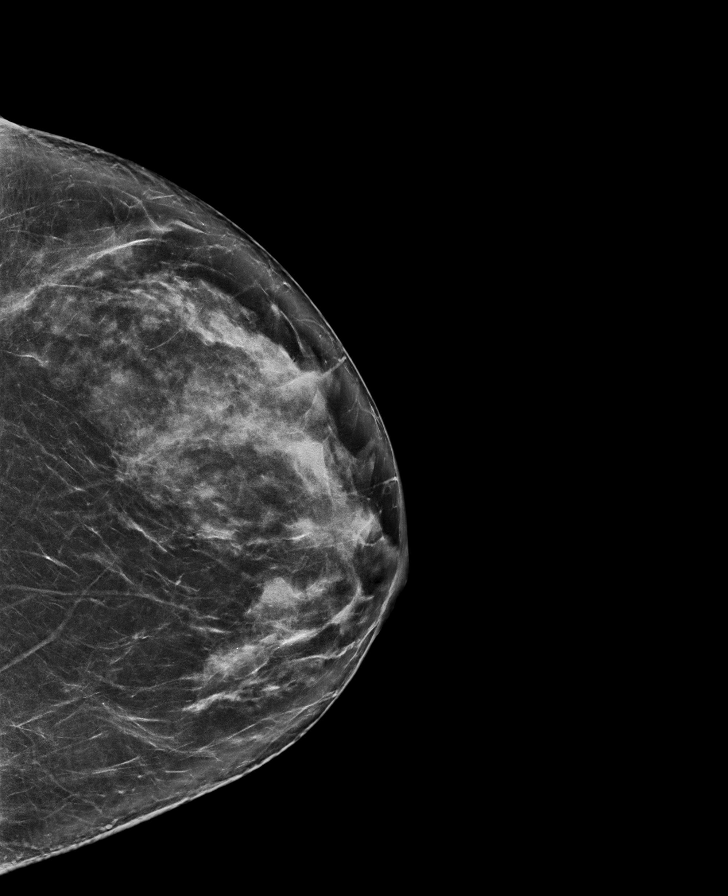

[R CC synth-2D]
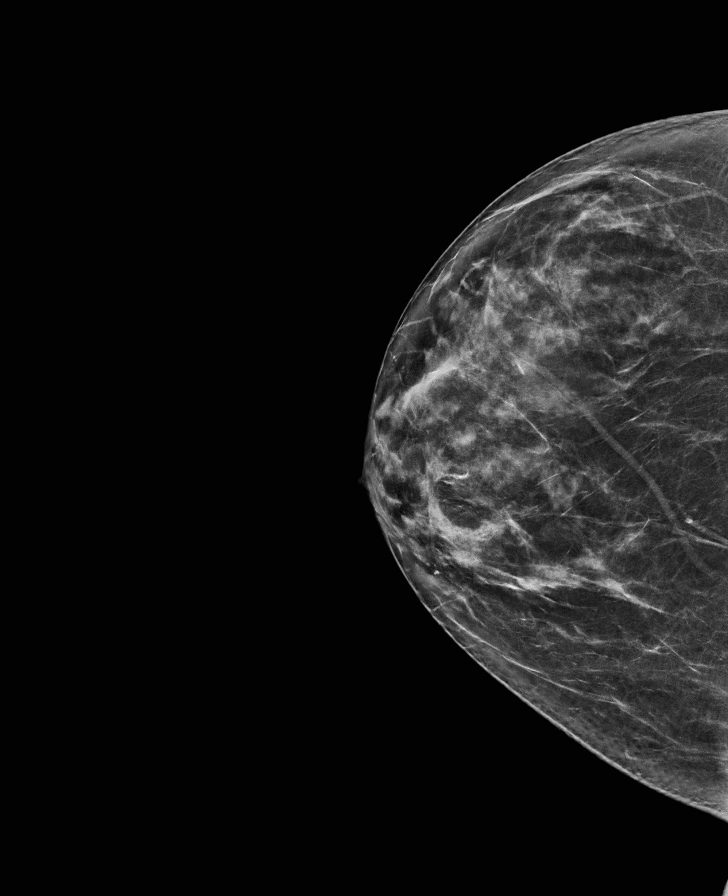

[R MLO synth-2D]
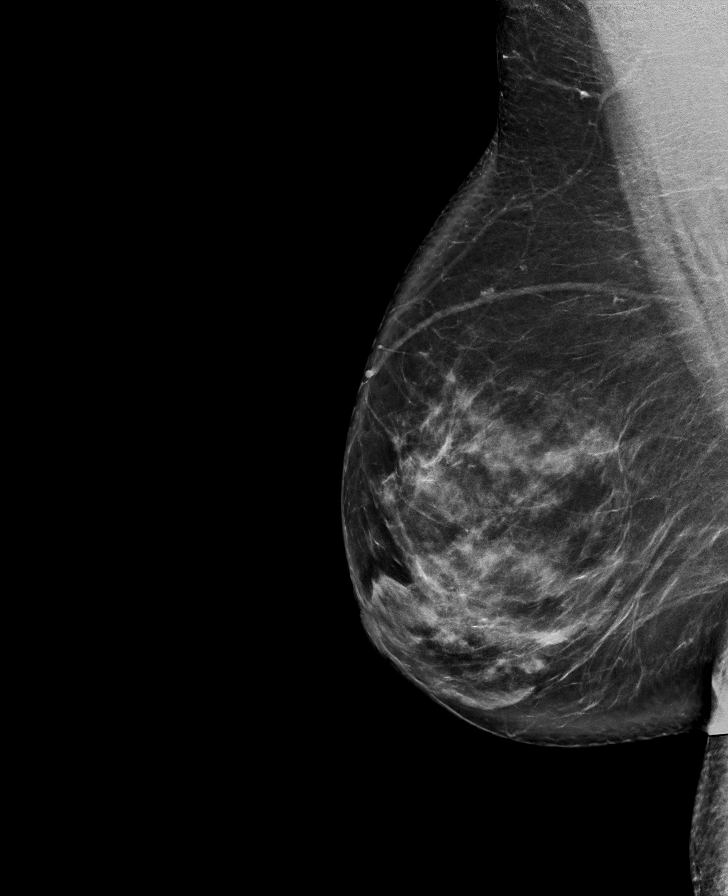

[L MLO synth-2D]
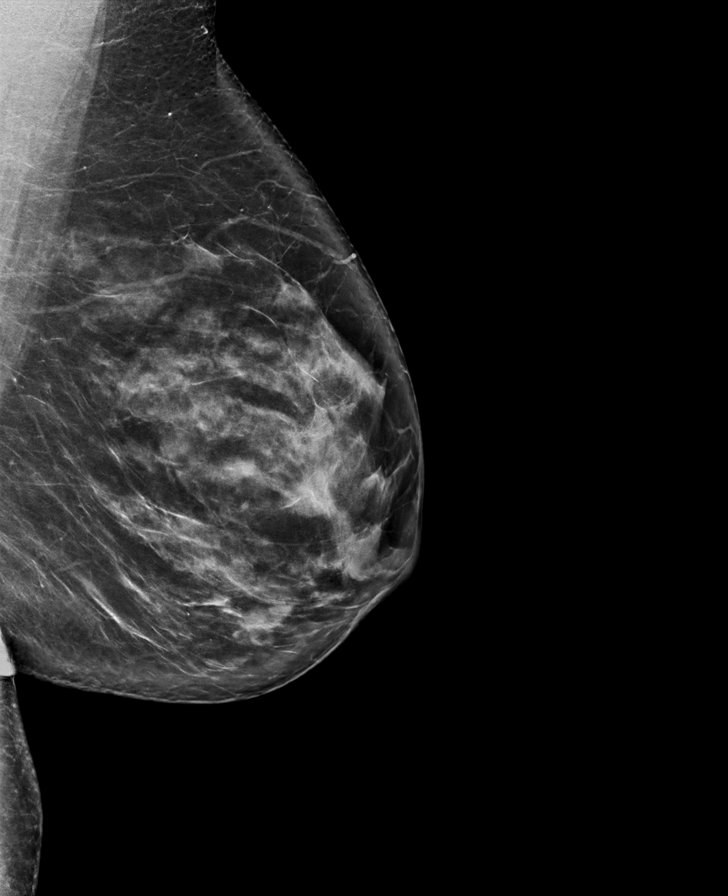

[L CC tomo · tomo slice 42/83.0]
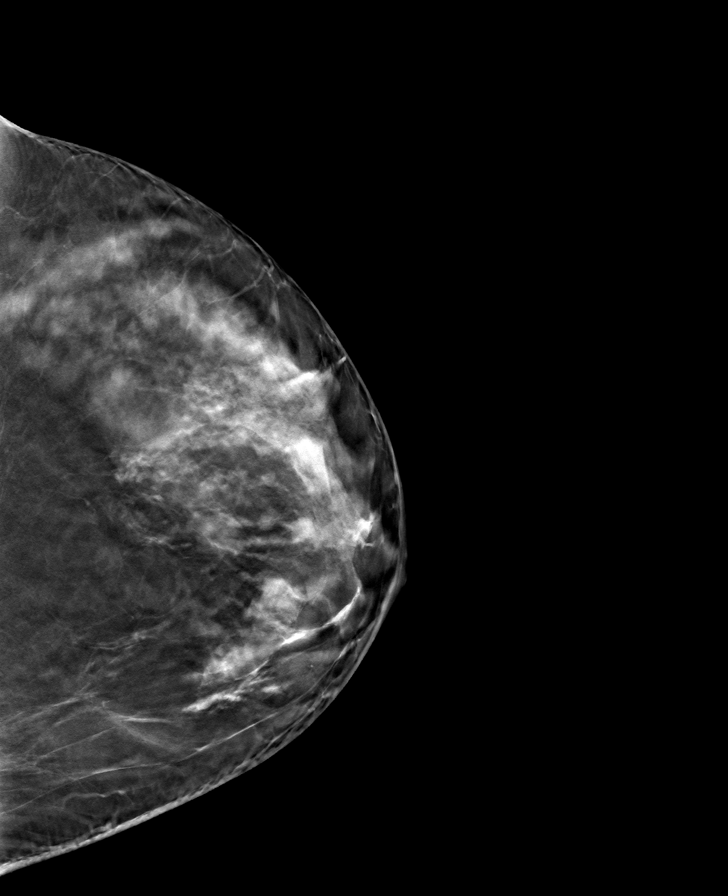

[R CC tomo · tomo slice 39/78.0]
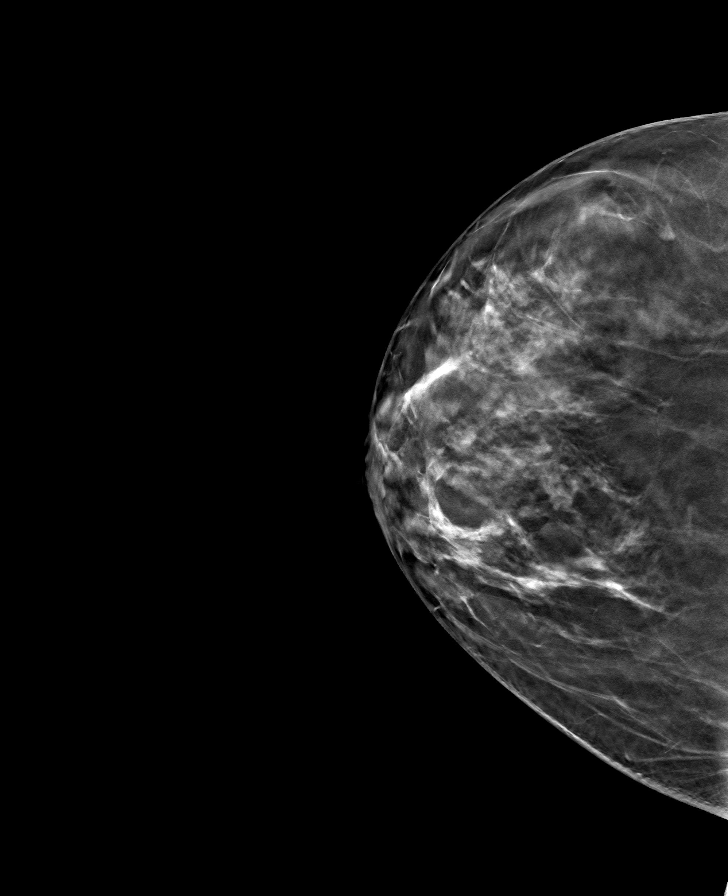

[L MLO tomo · tomo slice 47/93.0]
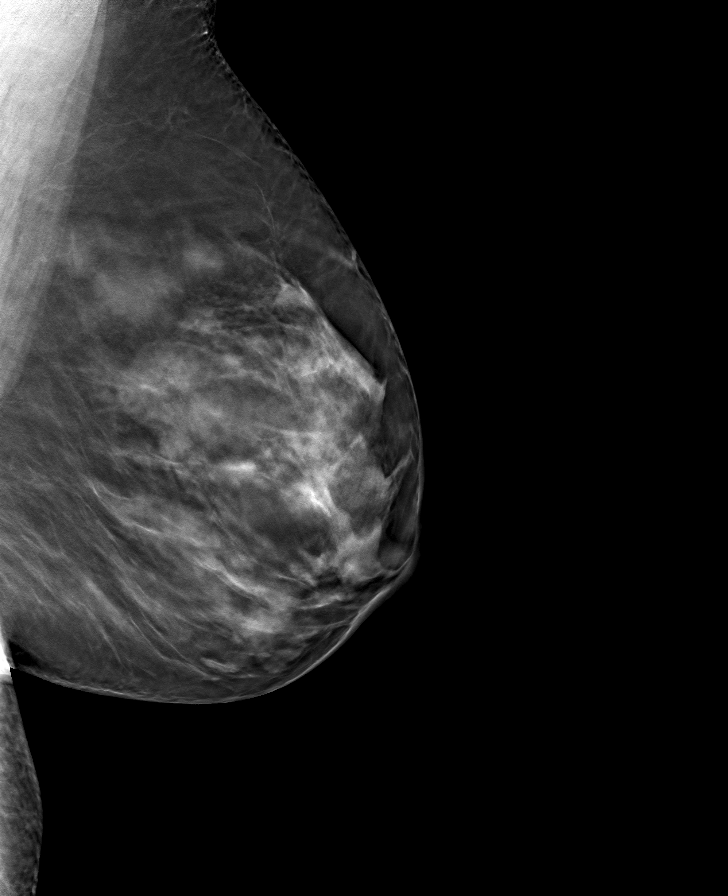

[R MLO tomo · tomo slice 47/93.0]
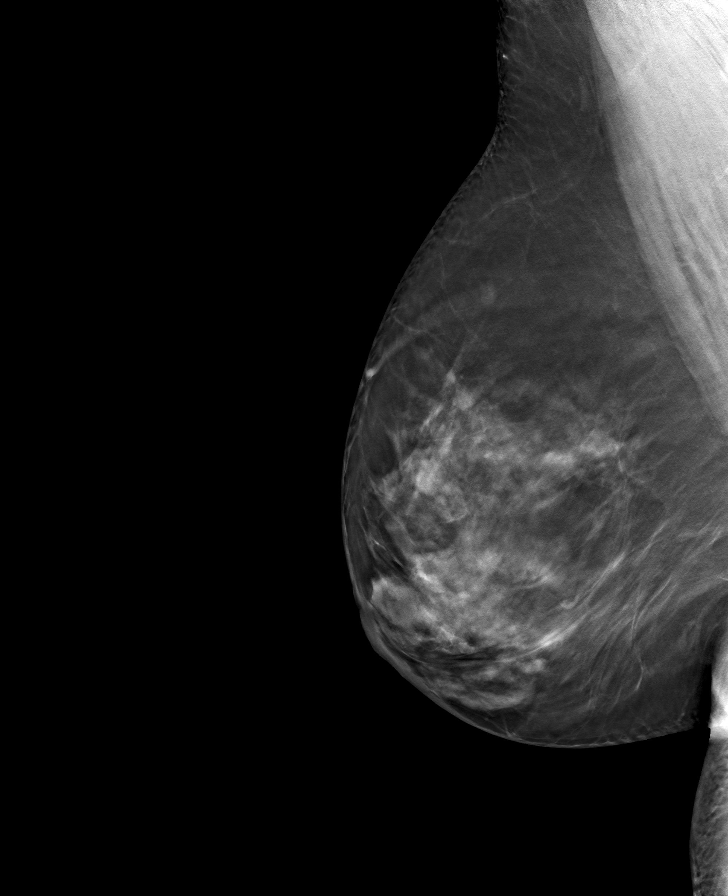

[8 of 24 positions shown; findings below may reference images not displayed]

ACR Breast Density Category c: The breast tissue is heterogeneously
dense, which may obscure small masses.
FINDINGS: There are no findings suspicious for malignancy. Images were
processed with CAD.
IMPRESSION: No mammographic evidence of malignancy. A result letter of this
screening mammogram will be mailed directly to the patient.

RECOMMENDATION:
Screening mammogram in one year. (Code:FT-U-LHB)

BI-RADS CATEGORY  1: Negative.

## 2020-10-01 ENCOUNTER — Ambulatory Visit: Payer: Self-pay | Admitting: Adult Health

## 2020-10-22 ENCOUNTER — Ambulatory Visit: Payer: Self-pay | Admitting: Adult Health

## 2020-10-23 ENCOUNTER — Ambulatory Visit: Payer: Self-pay | Admitting: Adult Health

## 2020-10-23 ENCOUNTER — Encounter: Payer: BC Managed Care – PPO | Admitting: Adult Health

## 2020-10-28 ENCOUNTER — Other Ambulatory Visit: Payer: Self-pay | Admitting: Adult Health

## 2020-10-28 DIAGNOSIS — I10 Essential (primary) hypertension: Secondary | ICD-10-CM

## 2021-01-21 ENCOUNTER — Encounter: Payer: Self-pay | Admitting: Adult Health

## 2021-01-21 ENCOUNTER — Other Ambulatory Visit: Payer: Self-pay

## 2021-01-21 ENCOUNTER — Ambulatory Visit (INDEPENDENT_AMBULATORY_CARE_PROVIDER_SITE_OTHER): Payer: BC Managed Care – PPO | Admitting: Adult Health

## 2021-01-21 VITALS — BP 143/91 | HR 96 | Temp 98.4°F | Resp 16 | Ht 65.0 in | Wt 214.8 lb

## 2021-01-21 DIAGNOSIS — R232 Flushing: Secondary | ICD-10-CM

## 2021-01-21 DIAGNOSIS — K219 Gastro-esophageal reflux disease without esophagitis: Secondary | ICD-10-CM | POA: Diagnosis not present

## 2021-01-21 DIAGNOSIS — Z6835 Body mass index (BMI) 35.0-35.9, adult: Secondary | ICD-10-CM

## 2021-01-21 DIAGNOSIS — Z1231 Encounter for screening mammogram for malignant neoplasm of breast: Secondary | ICD-10-CM

## 2021-01-21 DIAGNOSIS — I10 Essential (primary) hypertension: Secondary | ICD-10-CM

## 2021-01-21 DIAGNOSIS — Z0000 Encounter for general adult medical examination without abnormal findings: Secondary | ICD-10-CM | POA: Diagnosis not present

## 2021-01-21 DIAGNOSIS — E559 Vitamin D deficiency, unspecified: Secondary | ICD-10-CM

## 2021-01-21 DIAGNOSIS — N926 Irregular menstruation, unspecified: Secondary | ICD-10-CM

## 2021-01-21 DIAGNOSIS — F330 Major depressive disorder, recurrent, mild: Secondary | ICD-10-CM

## 2021-01-21 DIAGNOSIS — E538 Deficiency of other specified B group vitamins: Secondary | ICD-10-CM

## 2021-01-21 DIAGNOSIS — Z1159 Encounter for screening for other viral diseases: Secondary | ICD-10-CM

## 2021-01-21 DIAGNOSIS — F339 Major depressive disorder, recurrent, unspecified: Secondary | ICD-10-CM

## 2021-01-21 DIAGNOSIS — Z87891 Personal history of nicotine dependence: Secondary | ICD-10-CM | POA: Diagnosis not present

## 2021-01-21 DIAGNOSIS — E785 Hyperlipidemia, unspecified: Secondary | ICD-10-CM

## 2021-01-21 MED ORDER — AMLODIPINE BESYLATE 10 MG PO TABS: 10.0000 mg | ORAL_TABLET | Freq: Every day | ORAL | 1 refills | Status: DC

## 2021-01-21 MED ORDER — OMEPRAZOLE 40 MG PO CPDR: 40.0000 mg | DELAYED_RELEASE_CAPSULE | Freq: Every day | ORAL | 3 refills | Status: AC

## 2021-01-21 MED ORDER — BUPROPION HCL ER (XL) 150 MG PO TB24: 150.0000 mg | ORAL_TABLET | Freq: Two times a day (BID) | ORAL | 0 refills | Status: DC

## 2021-01-21 MED ORDER — CLONIDINE HCL 0.1 MG PO TABS: 0.1000 mg | ORAL_TABLET | Freq: Every day | ORAL | 0 refills | Status: DC

## 2021-01-21 NOTE — Patient Instructions (Addendum)
Call to schedule your screening mammogram. Your orders have been placed for your exam.  Let our office know if you have questions, concerns, or any difficulty scheduling.  If normal results then yearly screening mammograms are recommended unless you notice  Changes in your breast then you should schedule a follow up office visit. If abnormal results  Further imaging will be warranted and sooner follow up as determined by the radiologist at the [facility].   [facility] [place], [place] [address]  Main: [phone]   due  After July for mammogram.    Gastroesophageal Reflux Disease, Adult  Gastroesophageal reflux (GER) happens when acid from the stomach flows up into the tube that connects the mouth and the stomach (esophagus). Normally, food travels down the esophagus and stays in the stomach to be digested. With GER, food and stomach acid sometimes move back up into the esophagus. You may have a disease called gastroesophageal reflux disease (GERD) if the reflux:  Happens often.  Causes frequent or very bad symptoms.  Causes problems such as damage to the esophagus. When this happens, the esophagus becomes sore and swollen. Over time, GERD can make small holes (ulcers) in the lining of the esophagus. What are the causes? This condition is caused by a problem with the muscle between the esophagus and the stomach. When this muscle is weak or not normal, it does not close properly to keep food and acid from coming back up from the stomach. The muscle can be weak because of:  Tobacco use.  Pregnancy.  Having a certain type of hernia (hiatal hernia).  Alcohol use.  Certain foods and drinks, such as coffee, chocolate, onions, and peppermint. What increases the risk?  Being overweight.  Having a disease that affects your connective tissue.  Taking NSAIDs, such a ibuprofen. What are the signs or  symptoms?  Heartburn.  Difficult or painful swallowing.  The feeling of having a lump in the throat.  A bitter taste in the mouth.  Bad breath.  Having a lot of saliva.  Having an upset or bloated stomach.  Burping.  Chest pain. Different conditions can cause chest pain. Make sure you see your doctor if you have chest pain.  Shortness of breath or wheezing.  A long-term cough or a cough at night.  Wearing away of the surface of teeth (tooth enamel).  Weight loss. How is this treated?  Making changes to your diet.  Taking medicine.  Having surgery. Treatment will depend on how bad your symptoms are. Follow these instructions at home: Eating and drinking  Follow a diet as told by your doctor. You may need to avoid foods and drinks such as: ? Coffee and tea, with or without caffeine. ? Drinks that contain alcohol. ? Energy drinks and sports drinks. ? Bubbly (carbonated) drinks or sodas. ? Chocolate and cocoa. ? Peppermint and mint flavorings. ? Garlic and onions. ? Horseradish. ? Spicy and acidic foods. These include peppers, chili powder, curry powder, vinegar, hot sauces, and BBQ sauce. ? Citrus fruit juices and citrus fruits, such as oranges, lemons, and limes. ? Tomato-based foods. These include red sauce, chili, salsa, and pizza with red sauce. ? Fried and fatty foods. These include donuts, french fries, potato chips, and high-fat dressings. ? High-fat meats. These include hot dogs, rib eye steak, sausage, ham, and bacon. ? High-fat dairy items, such as whole milk, butter, and cream cheese.  Eat small meals often. Avoid  eating large meals.  Avoid drinking large amounts of liquid with your meals.  Avoid eating meals during the 2-3 hours before bedtime.  Avoid lying down right after you eat.  Do not exercise right after you eat.   Lifestyle  Do not smoke or use any products that contain nicotine or tobacco. If you need help quitting, ask your  doctor.  Try to lower your stress. If you need help doing this, ask your doctor.  If you are overweight, lose an amount of weight that is healthy for you. Ask your doctor about a safe weight loss goal.   General instructions  Pay attention to any changes in your symptoms.  Take over-the-counter and prescription medicines only as told by your doctor.  Do not take aspirin, ibuprofen, or other NSAIDs unless your doctor says it is okay.  Wear loose clothes. Do not wear anything tight around your waist.  Raise (elevate) the head of your bed about 6 inches (15 cm). You may need to use a wedge to do this.  Avoid bending over if this makes your symptoms worse.  Keep all follow-up visits. Contact a doctor if:  You have new symptoms.  You lose weight and you do not know why.  You have trouble swallowing or it hurts to swallow.  You have wheezing or a cough that keeps happening.  You have a hoarse voice.  Your symptoms do not get better with treatment. Get help right away if:  You have sudden pain in your arms, neck, jaw, teeth, or back.  You suddenly feel sweaty, dizzy, or light-headed.  You have chest pain or shortness of breath.  You vomit and the vomit is green, yellow, or black, or it looks like blood or coffee grounds.  You faint.  Your poop (stool) is red, bloody, or black.  You cannot swallow, drink, or eat. These symptoms may represent a serious problem that is an emergency. Do not wait to see if the symptoms will go away. Get medical help right away. Call your local emergency services (911 in the [place] Do not drive yourself to the hospital. Summary  If a person has gastroesophageal reflux disease (GERD), food and stomach acid move back up into the esophagus and cause symptoms or problems such as damage to the esophagus.  Treatment will depend on how bad your symptoms are.  Follow a diet as told by your doctor.  Take all medicines only as told by your  doctor. This information is not intended to replace advice given to you by your health care provider. Make sure you discuss any questions you have with your health care provider. Document Revised: 05/20/2020 Document Reviewed: 05/20/2020 Elsevier Patient Education  2021 [place] for Gastroesophageal Reflux Disease, Adult When you have gastroesophageal reflux disease (GERD), the foods you eat and your eating habits are very important. Choosing the right foods can help ease your discomfort. Think about working with a food expert (dietitian) to help you make good choices. What are tips for following this plan? Reading food labels  Look for foods that are low in saturated fat. Foods that may help with your symptoms include: ? Foods that have less than 5% of daily value (DV) of fat. ? Foods that have 0 grams of trans fat. Cooking  Do not fry your food.  Cook your food by baking, steaming, grilling, or broiling. These are all methods that do not need a lot of fat for cooking.  To add flavor, try to use herbs that are low in spice and acidity. Meal planning  Choose healthy foods that are low in fat, such as: ? Fruits and vegetables. ? Whole grains. ? Low-fat dairy products. ? Lean meats, fish, and poultry.  Eat small meals often instead of eating 3 large meals each day. Eat your meals slowly in a place where you are relaxed. Avoid bending over or lying down until 2-3 hours after eating.  Limit high-fat foods such as fatty meats or fried foods.  Limit your intake of fatty foods, such as oils, butter, and shortening.  Avoid the following as told by your doctor: ? Foods that cause symptoms. These may be different for different people. Keep a food diary to keep track of foods that cause symptoms. ? Alcohol. ? Drinking a lot of liquid with meals. ? Eating meals during the 2-3 hours before bed.   Lifestyle  Stay at a healthy weight. Ask your doctor what weight is  healthy for you. If you need to lose weight, work with your doctor to do so safely.  Exercise for at least 30 minutes on 5 or more days each week, or as told by your doctor.  Wear loose-fitting clothes.  Do not smoke or use any products that contain nicotine or tobacco. If you need help quitting, ask your doctor.  Sleep with the head of your bed higher than your feet. Use a wedge under the mattress or blocks under the bed frame to raise the head of the bed.  Chew sugar-free gum after meals. What foods should eat? Eat a healthy, well-balanced diet of fruits, vegetables, whole grains, low-fat dairy products, lean meats, fish, and poultry. Each person is different. Foods that may cause symptoms in one person may not cause any symptoms in another person. Work with your doctor to find foods that are safe for you. The items listed above may not be a complete list of what you can eat and drink. Contact a food expert for more options.   What foods should I avoid? Limiting some of these foods may help in managing the symptoms of GERD. Everyone is different. Talk with a food expert or your doctor to help you find the exact foods to avoid, if any. Fruits Any fruits prepared with added fat. Any fruits that cause symptoms. For some people, this may include citrus fruits, such as oranges, grapefruit, pineapple, and lemons. Vegetables Deep-fried vegetables. [place] fries. Any vegetables prepared with added fat. Any vegetables that cause symptoms. For some people, this may include tomatoes and tomato products, chili peppers, onions and garlic, and horseradish. Grains Pastries or quick breads with added fat. Meats and other proteins High-fat meats, such as fatty beef or pork, hot dogs, ribs, ham, sausage, salami, and bacon. Fried meat or protein, including fried fish and fried chicken. Nuts and nut butters, in large amounts. Dairy Whole milk and chocolate milk. Sour cream. Cream. Ice cream. Cream cheese.  Milkshakes. Fats and oils Butter. Margarine. Shortening. Ghee. Beverages Coffee and tea, with or without caffeine. Carbonated beverages. Sodas. Energy drinks. Fruit juice made with acidic fruits, such as orange or grapefruit. Tomato juice. Alcoholic drinks. Sweets and desserts Chocolate and cocoa. Donuts. Seasonings and condiments Pepper. Peppermint and spearmint. Added salt. Any condiments, herbs, or seasonings that cause symptoms. For some people, this may include curry, hot sauce, or vinegar-based salad dressings. The items listed above may not be a complete list of what you should not eat and drink.  Contact a food expert for more options. Questions to ask your doctor Diet and lifestyle changes are often the first steps that are taken to manage symptoms of GERD. If diet and lifestyle changes do not help, talk with your doctor about taking medicines. Where to find more information  International Foundation for Gastrointestinal Disorders: aboutgerd.org Summary  When you have GERD, food and lifestyle choices are very important in easing your symptoms.  Eat small meals often instead of 3 large meals a day. Eat your meals slowly and in a place where you are relaxed.  Avoid bending over or lying down until 2-3 hours after eating.  Limit high-fat foods such as fatty meats or fried foods. This information is not intended to replace advice given to you by your health care provider. Make sure you discuss any questions you have with your health care provider. Document Revised: 05/20/2020 Document Reviewed: 05/20/2020 Elsevier Patient Education  [place] Maintenance, Female Adopting a healthy lifestyle and getting preventive care are important in promoting health and wellness. Ask your health care provider about:  The right schedule for you to have regular tests and exams.  Things you can do on your own to prevent diseases and keep yourself healthy. What should I know about  diet, weight, and exercise? Eat a healthy diet  Eat a diet that includes plenty of vegetables, fruits, low-fat dairy products, and lean protein.  Do not eat a lot of foods that are high in solid fats, added sugars, or sodium.   Maintain a healthy weight Body mass index (BMI) is used to identify weight problems. It estimates body fat based on height and weight. Your health care provider can help determine your BMI and help you achieve or maintain a healthy weight. Get regular exercise Get regular exercise. This is one of the most important things you can do for your health. Most adults should:  Exercise for at least 150 minutes each week. The exercise should increase your heart rate and make you sweat (moderate-intensity exercise).  Do strengthening exercises at least twice a week. This is in addition to the moderate-intensity exercise.  Spend less time sitting. Even light physical activity can be beneficial. Watch cholesterol and blood lipids Have your blood tested for lipids and cholesterol at 48 years of age, then have this test every 5 years. Have your cholesterol levels checked more often if:  Your lipid or cholesterol levels are high.  You are older than 48 years of age.  You are at high risk for heart disease. What should I know about cancer screening? Depending on your health history and family history, you may need to have cancer screening at various ages. This may include screening for:  Breast cancer.  Cervical cancer.  Colorectal cancer.  Skin cancer.  Lung cancer. What should I know about heart disease, diabetes, and high blood pressure? Blood pressure and heart disease  High blood pressure causes heart disease and increases the risk of stroke. This is more likely to develop in people who have high blood pressure readings, are of African descent, or are overweight.  Have your blood pressure checked: ? Every 3-5 years if you are 20-8 years of age. ? Every year  if you are 66 years old or older. Diabetes Have regular diabetes screenings. This checks your fasting blood sugar level. Have the screening done:  Once every three years after age 83 if you are at a normal weight and have a low risk for diabetes.  More often and at a younger age if you are overweight or have a high risk for diabetes. What should I know about preventing infection? Hepatitis B If you have a higher risk for hepatitis B, you should be screened for this virus. Talk with your health care provider to find out if you are at risk for hepatitis B infection. Hepatitis C Testing is recommended for:  Everyone born from [birth year] through 1963.  Anyone with known risk factors for hepatitis C. Sexually transmitted infections (STIs)  Get screened for STIs, including gonorrhea and chlamydia, if: ? You are sexually active and are younger than 48 years of age. ? You are older than 48 years of age and your health care provider tells you that you are at risk for this type of infection. ? Your sexual activity has changed since you were last screened, and you are at increased risk for chlamydia or gonorrhea. Ask your health care provider if you are at risk.  Ask your health care provider about whether you are at high risk for HIV. Your health care provider may recommend a prescription medicine to help prevent HIV infection. If you choose to take medicine to prevent HIV, you should first get tested for HIV. You should then be tested every 3 months for as long as you are taking the medicine. Pregnancy  If you are about to stop having your period (premenopausal) and you may become pregnant, seek counseling before you get pregnant.  Take 400 to 800 micrograms (mcg) of folic acid every day if you become pregnant.  Ask for birth control (contraception) if you want to prevent pregnancy. Osteoporosis and menopause Osteoporosis is a disease in which the bones lose minerals and strength with aging. This  can result in bone fractures. If you are 84 years old or older, or if you are at risk for osteoporosis and fractures, ask your health care provider if you should:  Be screened for bone loss.  Take a calcium or vitamin D supplement to lower your risk of fractures.  Be given hormone replacement therapy (HRT) to treat symptoms of menopause. Follow these instructions at home: Lifestyle  Do not use any products that contain nicotine or tobacco, such as cigarettes, e-cigarettes, and chewing tobacco. If you need help quitting, ask your health care provider.  Do not use street drugs.  Do not share needles.  Ask your health care provider for help if you need support or information about quitting drugs. Alcohol use  Do not drink alcohol if: ? Your health care provider tells you not to drink. ? You are pregnant, may be pregnant, or are planning to become pregnant.  If you drink alcohol: ? Limit how much you use to 0-1 drink a day. ? Limit intake if you are breastfeeding.  Be aware of how much alcohol is in your drink. In the [place] one drink equals one 12 oz bottle of beer (355 mL), one 5 oz glass of wine (148 mL), or one 1 oz glass of hard liquor (44 mL). General instructions  Schedule regular health, dental, and eye exams.  Stay current with your vaccines.  Tell your health care provider if: ? You often feel depressed. ? You have ever been abused or do not feel safe at home. Summary  Adopting a healthy lifestyle and getting preventive care are important in promoting health and wellness.  Follow your health care provider's instructions about healthy diet, exercising, and getting tested or screened for diseases.  Follow your  health care provider's instructions on monitoring your cholesterol and blood pressure. This information is not intended to replace advice given to you by your health care provider. Make sure you discuss any questions you have with your health care  provider. Document Revised: 11/02/2018 Document Reviewed: 11/02/2018 Elsevier Patient Education  2021 [place] for [employer] Calories are units of energy. Your body needs a certain number of calories from food to keep going throughout the day. When you eat or drink more calories than your body needs, your body stores the extra calories mostly as fat. When you eat or drink fewer calories than your body needs, your body burns fat to get the energy it needs. Calorie counting means keeping track of how many calories you eat and drink each day. Calorie counting can be helpful if you need to lose weight. If you eat fewer calories than your body needs, you should lose weight. Ask your health care provider what a healthy weight is for you. For calorie counting to work, you will need to eat the right number of calories each day to lose a healthy amount of weight per week. A dietitian can help you figure out how many calories you need in a day and will suggest ways to reach your calorie goal.  A healthy amount of weight to lose each week is usually 1-2 lb (0.5-0.9 kg). This usually means that your daily calorie intake should be reduced by 500-750 calories.  Eating 1,200-1,500 calories a day can help most women lose weight.  Eating 1,500-1,800 calories a day can help most men lose weight. What do I need to know about calorie counting? Work with your health care provider or dietitian to determine how many calories you should get each day. To meet your daily calorie goal, you will need to:  Find out how many calories are in each food that you would like to eat. Try to do this before you eat.  Decide how much of the food you plan to eat.  Keep a food log. Do this by writing down what you ate and how many calories it had. To successfully lose weight, it is important to balance calorie counting with a healthy lifestyle that includes regular activity. Where do I find calorie  information? The number of calories in a food can be found on a Nutrition Facts label. If a food does not have a Nutrition Facts label, try to look up the calories online or ask your dietitian for help. Remember that calories are listed per serving. If you choose to have more than one serving of a food, you will have to multiply the calories per serving by the number of servings you plan to eat. For example, the label on a package of bread might say that a serving size is 1 slice and that there are 90 calories in a serving. If you eat 1 slice, you will have eaten 90 calories. If you eat 2 slices, you will have eaten 180 calories.   How do I keep a food log? After each time that you eat, record the following in your food log as soon as possible:  What you ate. Be sure to include toppings, sauces, and other extras on the food.  How much you ate. This can be measured in cups, ounces, or number of items.  How many calories were in each food and drink.  The total number of calories in the food you ate. Keep your food  log near you, such as in a pocket-sized notebook or on an app or website on your mobile phone. Some programs will calculate calories for you and show you how many calories you have left to meet your daily goal. What are some portion-control tips?  Know how many calories are in a serving. This will help you know how many servings you can have of a certain food.  Use a measuring cup to measure serving sizes. You could also try weighing out portions on a kitchen scale. With time, you will be able to estimate serving sizes for some foods.  Take time to put servings of different foods on your favorite plates or in your favorite bowls and cups so you know what a serving looks like.  Try not to eat straight from a food's packaging, such as from a bag or box. Eating straight from the package makes it hard to see how much you are eating and can lead to overeating. Put the amount you would like  to eat in a cup or on a plate to make sure you are eating the right portion.  Use smaller plates, glasses, and bowls for smaller portions and to prevent overeating.  Try not to multitask. For example, avoid watching TV or using your computer while eating. If it is time to eat, sit down at a table and enjoy your food. This will help you recognize when you are full. It will also help you be more mindful of what and how much you are eating. What are tips for following this plan? Reading food labels  Check the calorie count compared with the serving size. The serving size may be smaller than what you are used to eating.  Check the source of the calories. Try to choose foods that are high in protein, fiber, and vitamins, and low in saturated fat, trans fat, and sodium. Shopping  Read nutrition labels while you shop. This will help you make healthy decisions about which foods to buy.  Pay attention to nutrition labels for low-fat or fat-free foods. These foods sometimes have the same number of calories or more calories than the full-fat versions. They also often have added sugar, starch, or salt to make up for flavor that was removed with the fat.  Make a grocery list of lower-calorie foods and stick to it. Cooking  Try to cook your favorite foods in a healthier way. For example, try baking instead of frying.  Use low-fat dairy products. Meal planning  Use more fruits and vegetables. One-half of your plate should be fruits and vegetables.  Include lean proteins, such as chicken, [place], and fish. Lifestyle Each week, aim to do one of the following:  150 minutes of moderate exercise, such as walking.  75 minutes of vigorous exercise, such as running. General information  Know how many calories are in the foods you eat most often. This will help you calculate calorie counts faster.  Find a way of tracking calories that works for you. Get creative. Try different apps or programs if  writing down calories does not work for you. What foods should I eat?  Eat nutritious foods. It is better to have a nutritious, high-calorie food, such as an avocado, than a food with few nutrients, such as a bag of potato chips.  Use your calories on foods and drinks that will fill you up and will not leave you hungry soon after eating. ? Examples of foods that fill you up are nuts and  nut butters, vegetables, lean proteins, and high-fiber foods such as whole grains. High-fiber foods are foods with more than 5 g of fiber per serving.  Pay attention to calories in drinks. Low-calorie drinks include water and unsweetened drinks. The items listed above may not be a complete list of foods and beverages you can eat. Contact a dietitian for more information.   What foods should I limit? Limit foods or drinks that are not good sources of vitamins, minerals, or protein or that are high in unhealthy fats. These include:  Candy.  Other sweets.  Sodas, specialty coffee drinks, alcohol, and juice. The items listed above may not be a complete list of foods and beverages you should avoid. Contact a dietitian for more information. How do I count calories when eating out?  Pay attention to portions. Often, portions are much larger when eating out. Try these tips to keep portions smaller: ? Consider sharing a meal instead of getting your own. ? If you get your own meal, eat only half of it. Before you start eating, ask for a container and put half of your meal into it. ? When available, consider ordering smaller portions from the menu instead of full portions.  Pay attention to your food and drink choices. Knowing the way food is cooked and what is included with the meal can help you eat fewer calories. ? If calories are listed on the menu, choose the lower-calorie options. ? Choose dishes that include vegetables, fruits, whole grains, low-fat dairy products, and lean proteins. ? Choose items that are  boiled, broiled, grilled, or steamed. Avoid items that are buttered, battered, fried, or served with cream sauce. Items labeled as crispy are usually fried, unless stated otherwise. ? Choose water, low-fat milk, unsweetened iced tea, or other drinks without added sugar. If you want an alcoholic beverage, choose a lower-calorie option, such as a glass of wine or light beer. ? Ask for dressings, sauces, and syrups on the side. These are usually high in calories, so you should limit the amount you eat. ? If you want a salad, choose a garden salad and ask for grilled meats. Avoid extra toppings such as bacon, cheese, or fried items. Ask for the dressing on the side, or ask for olive oil and vinegar or lemon to use as dressing.  Estimate how many servings of a food you are given. Knowing serving sizes will help you be aware of how much food you are eating at restaurants. Where to find more information  Centers for Disease Control and Prevention: http://www.wolf.info/  [place] Department of Agriculture: http://www.wilson-mendoza.org/ Summary  Calorie counting means keeping track of how many calories you eat and drink each day. If you eat fewer calories than your body needs, you should lose weight.  A healthy amount of weight to lose per week is usually 1-2 lb (0.5-0.9 kg). This usually means reducing your daily calorie intake by 500-750 calories.  The number of calories in a food can be found on a Nutrition Facts label. If a food does not have a Nutrition Facts label, try to look up the calories online or ask your dietitian for help.  Use smaller plates, glasses, and bowls for smaller portions and to prevent overeating.  Use your calories on foods and drinks that will fill you up and not leave you hungry shortly after a meal. This information is not intended to replace advice given to you by your health care provider. Make sure you discuss any questions you  have with your health care provider. Document Revised: 12/21/2019 Document  Reviewed: 12/21/2019 Elsevier Patient Education  2021 [place] and Cholesterol Restricted Eating Plan Getting too much fat and cholesterol in your diet may cause health problems. Choosing the right foods helps keep your fat and cholesterol at normal levels. This can keep you from getting certain diseases. Your doctor may recommend an eating plan that includes:  Total fat: ______% or less of total calories a day.  Saturated fat: ______% or less of total calories a day.  Cholesterol: less than _________mg a day.  Fiber: ______g a day. What are tips for following this plan? Meal planning  At meals, divide your plate into four equal parts: ? Fill one-half of your plate with vegetables and green salads. ? Fill one-fourth of your plate with whole grains. ? Fill one-fourth of your plate with low-fat (lean) protein foods.  Eat fish that is high in omega-3 fats at least two times a week. This includes mackerel, tuna, sardines, and salmon.  Eat foods that are high in fiber, such as whole grains, beans, apples, broccoli, carrots, peas, and barley. General tips  Work with your doctor to lose weight if you need to.  Avoid: ? Foods with added sugar. ? Fried foods. ? Foods with partially hydrogenated oils.  Limit alcohol intake to no more than 1 drink a day for nonpregnant women and 2 drinks a day for men. One drink equals 12 oz of beer, 5 oz of wine, or 1 oz of hard liquor.   Reading food labels  Check food labels for: ? Trans fats. ? Partially hydrogenated oils. ? Saturated fat (g) in each serving. ? Cholesterol (mg) in each serving. ? Fiber (g) in each serving.  Choose foods with healthy fats, such as: ? Monounsaturated fats. ? Polyunsaturated fats. ? Omega-3 fats.  Choose grain products that have whole grains. Look for the word "whole" as the first word in the ingredient list. Cooking  Cook foods using low-fat methods. These include baking, boiling, grilling,  and broiling.  Eat more home-cooked foods. Eat at restaurants and buffets less often.  Avoid cooking using saturated fats, such as butter, cream, palm oil, palm kernel oil, and coconut oil. Recommended foods Fruits  All fresh, canned (in natural juice), or frozen fruits. Vegetables  Fresh or frozen vegetables (raw, steamed, roasted, or grilled). Green salads. Grains  Whole grains, such as whole wheat or whole grain breads, crackers, cereals, and pasta. Unsweetened oatmeal, bulgur, barley, quinoa, or brown rice. Corn or whole wheat flour tortillas. Meats and other protein foods  Ground beef (85% or leaner), grass-fed beef, or beef trimmed of fat. Skinless chicken or [place]. Ground chicken or [place]. Pork trimmed of fat. All fish and seafood. Egg whites. Dried beans, peas, or lentils. Unsalted nuts or seeds. Unsalted canned beans. Nut butters without added sugar or oil. Dairy  Low-fat or nonfat dairy products, such as skim or 1% milk, 2% or reduced-fat cheeses, low-fat and fat-free ricotta or cottage cheese, or plain low-fat and nonfat yogurt. Fats and oils  Tub margarine without trans fats. Light or reduced-fat mayonnaise and salad dressings. Avocado. Olive, canola, sesame, or safflower oils. The items listed above may not be a complete list of foods and beverages you can eat. Contact a dietitian for more information.   Foods to avoid Fruits  Canned fruit in heavy syrup. Fruit in cream or butter sauce. Fried fruit. Vegetables  Vegetables cooked in cheese, cream, or butter  sauce. [place] vegetables. Grains  White bread. White pasta. White rice. Cornbread. Bagels, pastries, and croissants. Crackers and snack foods that contain trans fat and hydrogenated oils. Meats and other protein foods  Fatty cuts of meat. Ribs, chicken wings, bacon, sausage, bologna, salami, chitterlings, fatback, hot dogs, bratwurst, and packaged lunch meats. Liver and organ meats. Whole eggs and egg yolks.  Chicken and [place] with skin. Fried meat. Dairy  Whole or 2% milk, cream, half-and-half, and cream cheese. Whole milk cheeses. Whole-fat or sweetened yogurt. Full-fat cheeses. Nondairy creamers and whipped toppings. Processed cheese, cheese spreads, and cheese curds. Beverages  Alcohol. Sugar-sweetened drinks such as sodas, lemonade, and fruit drinks. Fats and oils  Butter, stick margarine, lard, shortening, ghee, or bacon fat. Coconut, palm kernel, and palm oils. Sweets and desserts  Corn syrup, sugars, honey, and molasses. Candy. Jam and jelly. Syrup. Sweetened cereals. Cookies, pies, cakes, donuts, muffins, and ice cream. The items listed above may not be a complete list of foods and beverages you should avoid. Contact a dietitian for more information. Summary  Choosing the right foods helps keep your fat and cholesterol at normal levels. This can keep you from getting certain diseases.  At meals, fill one-half of your plate with vegetables and green salads.  Eat high-fiber foods, like whole grains, beans, apples, carrots, peas, and barley.  Limit added sugar, saturated fats, alcohol, and fried foods. This information is not intended to replace advice given to you by your health care provider. Make sure you discuss any questions you have with your health care provider. Document Revised: 03/13/2020 Document Reviewed: 03/13/2020 Elsevier Patient Education  2021 [employer].
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
## 2021-01-22 ENCOUNTER — Other Ambulatory Visit: Payer: Self-pay | Admitting: Adult Health

## 2021-01-22 DIAGNOSIS — K219 Gastro-esophageal reflux disease without esophagitis: Secondary | ICD-10-CM | POA: Insufficient documentation

## 2021-01-22 DIAGNOSIS — Z6835 Body mass index (BMI) 35.0-35.9, adult: Secondary | ICD-10-CM | POA: Insufficient documentation

## 2021-01-22 DIAGNOSIS — Z1159 Encounter for screening for other viral diseases: Secondary | ICD-10-CM | POA: Insufficient documentation

## 2021-01-22 DIAGNOSIS — E538 Deficiency of other specified B group vitamins: Secondary | ICD-10-CM | POA: Insufficient documentation

## 2021-01-22 LAB — COMPREHENSIVE METABOLIC PANEL
ALT: 28 IU/L (ref 0–32)
AST: 24 IU/L (ref 0–40)
Albumin/Globulin Ratio: 1.3 (ref 1.2–2.2)
Albumin: 4.1 g/dL (ref 3.8–4.8)
Alkaline Phosphatase: 82 IU/L (ref 44–121)
BUN/Creatinine Ratio: 11 (ref 9–23)
BUN: 8 mg/dL (ref 6–24)
Bilirubin Total: 0.3 mg/dL (ref 0.0–1.2)
CO2: 23 mmol/L (ref 20–29)
Calcium: 9.2 mg/dL (ref 8.7–10.2)
Chloride: 99 mmol/L (ref 96–106)
Creatinine, Ser: 0.7 mg/dL (ref 0.57–1.00)
Globulin, Total: 3.2 g/dL (ref 1.5–4.5)
Glucose: 104 mg/dL — ABNORMAL HIGH (ref 65–99)
Potassium: 4 mmol/L (ref 3.5–5.2)
Sodium: 140 mmol/L (ref 134–144)
Total Protein: 7.3 g/dL (ref 6.0–8.5)
eGFR: 107 mL/min/{1.73_m2} (ref >59)

## 2021-01-22 LAB — TSH: TSH: 0.991 u[IU]/mL (ref 0.450–4.500)

## 2021-01-22 LAB — LIPID PANEL W/O CHOL/HDL RATIO
Cholesterol, Total: 220 mg/dL — ABNORMAL HIGH (ref 100–199)
HDL: 63 mg/dL (ref >39)
LDL Chol Calc (NIH): 143 mg/dL — ABNORMAL HIGH (ref 0–99)
Triglycerides: 79 mg/dL (ref 0–149)
VLDL Cholesterol Cal: 14 mg/dL (ref 5–40)

## 2021-01-22 LAB — CBC WITH DIFFERENTIAL/PLATELET
Basophils Absolute: 0 10*3/uL (ref 0.0–0.2)
Basos: 1 %
EOS (ABSOLUTE): 0.1 10*3/uL (ref 0.0–0.4)
Eos: 2 %
Hematocrit: 37.6 % (ref 34.0–46.6)
Hemoglobin: 12.9 g/dL (ref 11.1–15.9)
Immature Grans (Abs): 0 10*3/uL (ref 0.0–0.1)
Immature Granulocytes: 0 %
Lymphocytes Absolute: 2.1 10*3/uL (ref 0.7–3.1)
Lymphs: 32 %
MCH: 31.8 pg (ref 26.6–33.0)
MCHC: 34.3 g/dL (ref 31.5–35.7)
MCV: 93 fL (ref 79–97)
Monocytes Absolute: 0.5 10*3/uL (ref 0.1–0.9)
Monocytes: 7 %
Neutrophils Absolute: 3.8 10*3/uL (ref 1.4–7.0)
Neutrophils: 58 %
Platelets: 360 10*3/uL (ref 150–450)
RBC: 4.06 x10E6/uL (ref 3.77–5.28)
RDW: 13.5 % (ref 11.7–15.4)
WBC: 6.6 10*3/uL (ref 3.4–10.8)

## 2021-01-22 LAB — ESTROGENS, TOTAL: Estrogen: 125 pg/mL

## 2021-01-22 LAB — VITAMIN B12: Vitamin B-12: 464 pg/mL (ref 232–1245)

## 2021-01-22 LAB — VITAMIN D 25 HYDROXY (VIT D DEFICIENCY, FRACTURES): Vit D, 25-Hydroxy: 21.6 ng/mL — ABNORMAL LOW (ref 30.0–100.0)

## 2021-01-22 LAB — PROLACTIN: Prolactin: 16.3 ng/mL (ref 4.8–23.3)

## 2021-01-22 LAB — HEPATITIS C ANTIBODY: Hep C Virus Ab: <0.1 s/co ratio (ref 0.0–0.9)

## 2021-01-22 MED ORDER — VITAMIN D (ERGOCALCIFEROL) 1.25 MG (50000 UNIT) PO CAPS: 50000.0000 [IU] | ORAL_CAPSULE | ORAL | 1 refills | Status: AC

## 2021-01-22 NOTE — Progress Notes (Signed)
Meds ordered this encounter  Medications  . Vitamin D, Ergocalciferol, (DRISDOL) 1.25 MG (50000 UNIT) CAPS capsule    Sig: Take 1 capsule (50,000 Units total) by mouth every 7 (seven) days.    Dispense:  12 capsule    Refill:  1

## 2021-01-22 NOTE — Progress Notes (Signed)
CBC is within normal  CMP mildly elevated glucose add on Hemoglobin A1 C . Watch diet and increase exercise.   TSH is within normal limits.  Total cholesterol and LDL elevated.  Discuss lifestyle modification with patient e.g. increase exercise, fiber, fruits, vegetables, lean meat, and omega 3/fish intake and decrease saturated fat.  If patient following strict diet and exercise program already please schedule follow up appointment with primary care physician   Vitamin  D is low, this can contribute to poor sleep and fatigue, will send in prescription for Vitamin D at 50,000 units by mouth once every 7 days/(once weekly) for 12 weeks. Advise recheck lab Vitamin D in 1-2 weeks after completing vitamin d prescription. Lab iis walk in and is closed during lunch during regular office hours.  Vitamin D is sent to pharmacy. Recheck vitamin level as directed.  Recheck lipid panel in 3 to 6  months

## 2021-03-17 ENCOUNTER — Other Ambulatory Visit: Payer: Self-pay

## 2021-03-17 ENCOUNTER — Emergency Department: Payer: BC Managed Care – PPO

## 2021-03-17 ENCOUNTER — Emergency Department
Admission: EM | Admit: 2021-03-17 | Discharge: 2021-03-17 | Disposition: A | Discharge status: Home / Self Care | Payer: BC Managed Care – PPO | Attending: Emergency Medicine | Admitting: Emergency Medicine

## 2021-03-17 DIAGNOSIS — Z79899 Other long term (current) drug therapy: Secondary | ICD-10-CM | POA: Diagnosis not present

## 2021-03-17 DIAGNOSIS — M79642 Pain in left hand: Secondary | ICD-10-CM | POA: Insufficient documentation

## 2021-03-17 DIAGNOSIS — I10 Essential (primary) hypertension: Secondary | ICD-10-CM | POA: Diagnosis not present

## 2021-03-17 DIAGNOSIS — Z87891 Personal history of nicotine dependence: Secondary | ICD-10-CM | POA: Insufficient documentation

## 2021-03-17 MED ORDER — TETANUS-DIPHTH-ACELL PERTUSSIS 5-2.5-18.5 LF-MCG/0.5 IM SUSY
0.5000 mL | PREFILLED_SYRINGE | Freq: Once | INTRAMUSCULAR | Status: DC
  Filled 2021-03-17: qty 0.5

## 2021-03-17 MED ORDER — LIDOCAINE HCL (PF) 1 % IJ SOLN
5.0000 mL | Freq: Once | INTRAMUSCULAR | Status: DC
  Filled 2021-03-17: qty 5

## 2021-03-17 MED ORDER — LIDOCAINE-EPINEPHRINE-TETRACAINE (LET) TOPICAL GEL
3.0000 mL | Freq: Once | TOPICAL | Status: DC
  Filled 2021-03-17: qty 3

## 2021-03-17 NOTE — ED Notes (Signed)
Went to patients room to administer medication. Patient was not in room. Checked bathrooms. Patient nowhere to be found.

## 2021-03-17 NOTE — ED Triage Notes (Signed)
Left hand was shut on house door, pt co pain to left 3rd, 4th and 5th fingers.
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
## 2021-05-07 ENCOUNTER — Other Ambulatory Visit: Payer: Self-pay | Admitting: Adult Health

## 2021-05-07 DIAGNOSIS — I10 Essential (primary) hypertension: Secondary | ICD-10-CM

## 2021-05-12 NOTE — Telephone Encounter (Signed)
Pt called and states that she needs refill of cloNIDine (CATAPRES) 0.1 MG tablet and that she has been out of it for three days. [facility] on [address]

## 2021-05-14 ENCOUNTER — Other Ambulatory Visit: Payer: Self-pay | Admitting: Adult Health

## 2021-05-14 ENCOUNTER — Encounter: Payer: Self-pay | Admitting: Family Medicine

## 2021-05-14 ENCOUNTER — Other Ambulatory Visit: Payer: Self-pay

## 2021-05-14 ENCOUNTER — Encounter: Payer: Self-pay | Admitting: Adult Health

## 2021-05-14 DIAGNOSIS — I10 Essential (primary) hypertension: Secondary | ICD-10-CM

## 2021-05-14 MED ORDER — CLONIDINE HCL 0.1 MG PO TABS: 0.1000 mg | ORAL_TABLET | Freq: Every day | ORAL | 0 refills | Status: DC

## 2021-05-14 MED ORDER — CLONIDINE HCL 0.1 MG PO TABS: 0.1000 mg | ORAL_TABLET | Freq: Every day | ORAL | 1 refills | Status: DC

## 2021-05-14 NOTE — Telephone Encounter (Signed)
PT called to advise that she is still waiting on her cloNIDine (CATAPRES) 0.1 MG tablet to be called into the [facility] on [address]. She has been out of her meds for 5 days now and needs a refill.

## 2021-05-15 NOTE — Telephone Encounter (Signed)
Pt sent in a MyChart Message on 05/14/21 stating that she will be leaving this establishment and finding a different doctor. She will not be keeping Laverna Peace as the PCP.
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
## 2021-07-16 ENCOUNTER — Other Ambulatory Visit: Payer: Self-pay | Admitting: Adult Health

## 2021-07-16 DIAGNOSIS — I10 Essential (primary) hypertension: Secondary | ICD-10-CM

## 2021-08-19 ENCOUNTER — Other Ambulatory Visit: Payer: Self-pay | Admitting: Adult Health

## 2021-08-19 DIAGNOSIS — I10 Essential (primary) hypertension: Secondary | ICD-10-CM

## 2021-09-27 ENCOUNTER — Other Ambulatory Visit: Payer: Self-pay | Admitting: Family Medicine

## 2021-09-27 DIAGNOSIS — I10 Essential (primary) hypertension: Secondary | ICD-10-CM

## 2021-09-27 NOTE — Telephone Encounter (Signed)
Requested medication (s) are due for refill today: yes  Requested medication (s) are on the active medication list: yes  Last refill:  05/14/21 #90  Future visit scheduled: no  Notes to clinic:  called pt and LM on VM to call back to make appt   Requested Prescriptions  Pending Prescriptions Disp Refills   cloNIDine (CATAPRES) 0.1 MG tablet [Pharmacy Med Name: cloNIDine HCl 0.1 MG Oral Tablet] 90 tablet 0    Sig: Take 1 tablet by mouth once daily     Cardiovascular:  Alpha-2 Agonists Failed - 09/27/2021 11:09 AM      Failed - Last BP in normal range    BP Readings from Last 1 Encounters:  03/17/21 (!) 132/94          Failed - Valid encounter within last 6 months    Recent Outpatient Visits           8 months ago Routine adult health maintenance   [place] Flinchum, Kelby Aline, FNP   1 year ago Depression, recurrent [facility])   [place] Flinchum, Kelby Aline, FNP   1 year ago Routine adult health maintenance   [place], Kelby Aline, FNP   1 year ago Essential hypertension   [facility] [place], [place]   2 years ago Acute bacterial conjunctivitis of right eye   [place], [place], [place]              Passed - Last Heart Rate in normal range    Pulse Readings from Last 1 Encounters:  03/17/21 (!) 101

## 2021-11-20 ENCOUNTER — Other Ambulatory Visit: Payer: Self-pay | Admitting: Adult Health

## 2021-11-20 DIAGNOSIS — F330 Major depressive disorder, recurrent, mild: Secondary | ICD-10-CM

## 2021-11-20 DIAGNOSIS — F339 Major depressive disorder, recurrent, unspecified: Secondary | ICD-10-CM

## 2021-11-20 MED ORDER — BUPROPION HCL ER (XL) 150 MG PO TB24: 150.0000 mg | ORAL_TABLET | Freq: Two times a day (BID) | ORAL | 0 refills | Status: DC

## 2021-11-20 NOTE — Telephone Encounter (Signed)
Last refill:01/21/2021 LOV:01/21/2021 New appt:12/03/2021

## 2021-11-21 ENCOUNTER — Other Ambulatory Visit: Payer: Self-pay | Admitting: Family Medicine

## 2021-11-21 DIAGNOSIS — F330 Major depressive disorder, recurrent, mild: Secondary | ICD-10-CM

## 2021-11-21 DIAGNOSIS — F339 Major depressive disorder, recurrent, unspecified: Secondary | ICD-10-CM

## 2021-11-24 ENCOUNTER — Other Ambulatory Visit: Payer: Self-pay | Admitting: Family Medicine

## 2021-11-24 DIAGNOSIS — F330 Major depressive disorder, recurrent, mild: Secondary | ICD-10-CM

## 2021-11-24 DIAGNOSIS — F339 Major depressive disorder, recurrent, unspecified: Secondary | ICD-10-CM

## 2021-11-25 NOTE — Telephone Encounter (Signed)
buPROPion (WELLBUTRIN XL) 150 MG 24 hr tablet 60 tablet 0 11/20/2021    Sig - Route: Take 1 tablet (150 mg total) by mouth 2 (two) times daily. - Oral   Sent to pharmacy as: buPROPion (WELLBUTRIN XL) 150 MG 24 hr tablet   E-Prescribing Status: Receipt confirmed by pharmacy (11/20/2021  4:10 PM EST)

## 2021-12-03 ENCOUNTER — Ambulatory Visit: Payer: BC Managed Care – PPO | Admitting: Family Medicine

## 2021-12-03 ENCOUNTER — Other Ambulatory Visit: Payer: Self-pay

## 2021-12-24 ENCOUNTER — Ambulatory Visit: Payer: BC Managed Care – PPO | Admitting: Family Medicine

## 2021-12-31 ENCOUNTER — Telehealth (INDEPENDENT_AMBULATORY_CARE_PROVIDER_SITE_OTHER): Payer: BC Managed Care – PPO | Admitting: Family Medicine

## 2021-12-31 ENCOUNTER — Other Ambulatory Visit: Payer: Self-pay

## 2021-12-31 DIAGNOSIS — F339 Major depressive disorder, recurrent, unspecified: Secondary | ICD-10-CM

## 2021-12-31 DIAGNOSIS — F330 Major depressive disorder, recurrent, mild: Secondary | ICD-10-CM

## 2021-12-31 DIAGNOSIS — I10 Essential (primary) hypertension: Secondary | ICD-10-CM

## 2021-12-31 MED ORDER — AMLODIPINE BESYLATE 10 MG PO TABS: 10.0000 mg | ORAL_TABLET | Freq: Every day | ORAL | 0 refills | Status: DC

## 2021-12-31 MED ORDER — BUPROPION HCL ER (XL) 150 MG PO TB24: 150.0000 mg | ORAL_TABLET | Freq: Two times a day (BID) | ORAL | 0 refills | Status: AC

## 2021-12-31 NOTE — Progress Notes (Signed)
° ° °  MyChart Video Visit   Unable to connect d/t delay in schedule  Gwyneth Sprout, [place] [phone] (phone) [phone] (fax)  [place]

## 2022-01-05 ENCOUNTER — Encounter: Payer: Self-pay | Admitting: Family Medicine

## 2022-01-06 ENCOUNTER — Telehealth: Payer: BC Managed Care – PPO | Admitting: Family Medicine

## 2022-05-02 ENCOUNTER — Other Ambulatory Visit: Payer: Self-pay | Admitting: Family Medicine

## 2022-05-02 DIAGNOSIS — I10 Essential (primary) hypertension: Secondary | ICD-10-CM

## 2022-05-04 ENCOUNTER — Telehealth: Payer: Self-pay

## 2022-05-04 ENCOUNTER — Other Ambulatory Visit: Payer: Self-pay | Admitting: Family Medicine

## 2022-05-04 DIAGNOSIS — I10 Essential (primary) hypertension: Secondary | ICD-10-CM

## 2022-05-04 MED ORDER — CLONIDINE HCL 0.1 MG PO TABS: 0.1000 mg | ORAL_TABLET | Freq: Every day | ORAL | 1 refills | Status: AC

## 2022-05-04 NOTE — Telephone Encounter (Signed)
Courtesy refill. Requested Prescriptions  Pending Prescriptions Disp Refills  . amLODipine (NORVASC) 10 MG tablet [Pharmacy Med Name: amLODIPine Besylate 10 MG Oral Tablet] 30 tablet 0    Sig: Take 1 tablet by mouth once daily     Cardiovascular: Calcium Channel Blockers 2 Failed - 05/02/2022  9:41 AM      Failed - Last BP in normal range    BP Readings from Last 1 Encounters:  03/17/21 (!) 132/94         Failed - Valid encounter within last 6 months    Recent Outpatient Visits          4 months ago Essential hypertension   [facility] Gwyneth Sprout, FNP   1 year ago Routine adult health maintenance   [place] Flinchum, Kelby Aline, FNP   1 year ago Depression, recurrent [facility])   [place] Flinchum, Kelby Aline, [place]   2 years ago Routine adult health maintenance   [place], Kelby Aline, [place]   2 years ago Essential hypertension   [place], [place], [place]             Passed - Last Heart Rate in normal range    Pulse Readings from Last 1 Encounters:  03/17/21 (!) 101

## 2022-05-04 NOTE — Telephone Encounter (Signed)
Copied from [place] [id number]. Topic: General - Other >> May 04, 2022 11:12 AM Everette C wrote: Reason for CRM: The patient has called to share that they will no longer be seen at [facility]  The patient has declined the assistance of the practice with further refills of their medication

## 2022-05-04 NOTE — Telephone Encounter (Signed)
Called pt and LM on VM to call office to schedule appt.

## 2022-12-15 ENCOUNTER — Other Ambulatory Visit: Payer: Self-pay | Admitting: Family Medicine

## 2022-12-15 DIAGNOSIS — Z1231 Encounter for screening mammogram for malignant neoplasm of breast: Secondary | ICD-10-CM

## 2023-01-29 ENCOUNTER — Other Ambulatory Visit: Payer: Self-pay | Admitting: Family Medicine

## 2023-01-29 ENCOUNTER — Ambulatory Visit
Admission: RE | Admit: 2023-01-29 | Discharge: 2023-01-29 | Disposition: A | Discharge status: Home / Self Care | Payer: BC Managed Care – PPO | Source: Ambulatory Visit | Attending: Family Medicine | Admitting: Family Medicine

## 2023-01-29 ENCOUNTER — Ambulatory Visit
Admission: RE | Admit: 2023-01-29 | Discharge: 2023-01-29 | Disposition: A | Discharge status: Home / Self Care | Payer: BC Managed Care – PPO | Attending: Family Medicine | Admitting: Family Medicine

## 2023-01-29 DIAGNOSIS — M79601 Pain in right arm: Secondary | ICD-10-CM | POA: Insufficient documentation

## 2023-06-11 ENCOUNTER — Ambulatory Visit
Admission: RE | Admit: 2023-06-11 | Discharge: 2023-06-11 | Disposition: A | Discharge status: Home / Self Care | Payer: BC Managed Care – PPO | Source: Ambulatory Visit | Attending: Family Medicine | Admitting: Family Medicine

## 2023-06-11 DIAGNOSIS — Z1231 Encounter for screening mammogram for malignant neoplasm of breast: Secondary | ICD-10-CM | POA: Insufficient documentation

## 2023-11-30 ENCOUNTER — Encounter: Payer: Self-pay | Admitting: Obstetrics

## 2023-11-30 ENCOUNTER — Ambulatory Visit: Payer: Medicaid Other | Admitting: Obstetrics

## 2023-11-30 ENCOUNTER — Other Ambulatory Visit (HOSPITAL_COMMUNITY)
Admission: RE | Admit: 2023-11-30 | Discharge: 2023-11-30 | Disposition: A | Discharge status: Home / Self Care | Payer: Medicaid Other | Source: Ambulatory Visit | Attending: Obstetrics | Admitting: Obstetrics

## 2023-11-30 VITALS — BP 116/67 | HR 84 | Ht 65.0 in | Wt 220.0 lb

## 2023-11-30 DIAGNOSIS — Z01419 Encounter for gynecological examination (general) (routine) without abnormal findings: Secondary | ICD-10-CM

## 2023-11-30 DIAGNOSIS — Z124 Encounter for screening for malignant neoplasm of cervix: Secondary | ICD-10-CM

## 2023-11-30 DIAGNOSIS — N951 Menopausal and female climacteric states: Secondary | ICD-10-CM
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
## 2023-11-30 NOTE — Patient Instructions (Signed)
 Preventive Care 51-51 Years Old, Female  Preventive care refers to lifestyle choices and visits with your health care provider that can promote health and wellness. Preventive care visits are also called wellness exams.  What can I expect for my preventive care visit?  Counseling  Your health care provider may ask you questions about your:  Medical history, including:  Past medical problems.  Family medical history.  Pregnancy history.  Current health, including:  Menstrual cycle.  Method of birth control.  Emotional well-being.  Home life and relationship well-being.  Sexual activity and sexual health.  Lifestyle, including:  Alcohol, nicotine or tobacco, and drug use.  Access to firearms.  Diet, exercise, and sleep habits.  Work and work Astron[employer].  Sunscreen use.  Safety issues such as seatbelt and bike helmet use.  Physical exam  Your health care provider will check your:  Height and weight. These may be used to calculate your BMI (body mass index). BMI is a measurement that tells if you are at a healthy weight.  Waist circumference. This measures the distance around your waistline. This measurement also tells if you are at a healthy weight and may help predict your risk of certain diseases, such as type 2 diabetes and high blood pressure.  Heart rate and blood pressure.  Body temperature.  Skin for abnormal spots.  What immunizations do I need?    Vaccines are usually given at various ages, according to a schedule. Your health care provider will recommend vaccines for you based on your age, medical history, and lifestyle or other factors, such as travel or where you work.  What tests do I need?  Screening  Your health care provider may recommend screening tests for certain conditions. This may include:  Lipid and cholesterol levels.  Diabetes screening. This is done by checking your blood sugar (glucose) after you have not eaten for a while (fasting).  Pelvic exam and Pap test.  Hepatitis B test.  Hepatitis C  test.  HIV (human immunodeficiency virus) test.  STI (sexually transmitted infection) testing, if you are at risk.  Lung cancer screening.  Colorectal cancer screening.  Mammogram. Talk with your health care provider about when you should start having regular mammograms. This may depend on whether you have a family history of breast cancer.  BRCA-related cancer screening. This may be done if you have a family history of breast, ovarian, tubal, or peritoneal cancers.  Bone density scan. This is done to screen for osteoporosis.  Talk with your health care provider about your test results, treatment options, and if necessary, the need for more tests.  Follow these instructions at home:  Eating and drinking    Eat a diet that includes fresh fruits and vegetables, whole grains, lean protein, and low-fat dairy products.  Take vitamin and mineral supplements as recommended by your health care provider.  Do not drink alcohol if:  Your health care provider tells you not to drink.  You are pregnant, may be pregnant, or are planning to become pregnant.  If you drink alcohol:  Limit how much you have to 0-1 drink a day.  Know how much alcohol is in your drink. In the [place] one drink equals one 12 oz bottle of beer (355 mL), one 5 oz glass of wine (148 mL), or one 1 oz glass of hard liquor (44 mL).  Lifestyle  Brush your teeth every morning and night with fluoride toothpaste. Floss one time each day.  Exercise for at least  30 minutes 5 or more days each week.  Do not use any products that contain nicotine or tobacco. These products include cigarettes, chewing tobacco, and vaping devices, such as e-cigarettes. If you need help quitting, ask your health care provider.  Do not use drugs.  If you are sexually active, practice safe sex. Use a condom or other form of protection to prevent STIs.  If you do not wish to become pregnant, use a form of birth control. If you plan to become pregnant, see your health care provider for a  prepregnancy visit.  Take aspirin only as told by your health care provider. Make sure that you understand how much to take and what form to take. Work with your health care provider to find out whether it is safe and beneficial for you to take aspirin daily.  Find healthy ways to manage stress, such as:  Meditation, yoga, or listening to music.  Journaling.  Talking to a trusted person.  Spending time with friends and family.  Minimize exposure to UV radiation to reduce your risk of skin cancer.  Safety  Always wear your seat belt while driving or riding in a vehicle.  Do not drive:  If you have been drinking alcohol. Do not ride with someone who has been drinking.  When you are tired or distracted.  While texting.  If you have been using any mind-altering substances or drugs.  Wear a helmet and other protective equipment during sports activities.  If you have firearms in your house, make sure you follow all gun safety procedures.  Seek help if you have been physically or sexually abused.  What's next?  Visit your health care provider once a year for an annual wellness visit.  Ask your health care provider how often you should have your eyes and teeth checked.  Stay up to date on all vaccines.  This information is not intended to replace advice given to you by your health care provider. Make sure you discuss any questions you have with your health care provider.  Document Revised: 05/07/2021 Document Reviewed: 05/07/2021  Elsevier Patient Education  2024 [employer].

## 2023-12-02 LAB — CYTOLOGY - PAP
Comment: NEGATIVE
Diagnosis: NEGATIVE
High risk HPV: NEGATIVE

## 2024-07-26 ENCOUNTER — Other Ambulatory Visit: Payer: Self-pay | Admitting: Family Medicine

## 2024-07-26 DIAGNOSIS — Z1231 Encounter for screening mammogram for malignant neoplasm of breast: Secondary | ICD-10-CM

## 2024-07-28 ENCOUNTER — Ambulatory Visit
Admission: RE | Admit: 2024-07-28 | Discharge: 2024-07-28 | Disposition: A | Discharge status: Home / Self Care | Source: Ambulatory Visit | Attending: Family Medicine | Admitting: Family Medicine

## 2024-07-28 DIAGNOSIS — Z1231 Encounter for screening mammogram for malignant neoplasm of breast: Secondary | ICD-10-CM | POA: Insufficient documentation
